# Patient Record
Sex: Male | Born: 1950
Health system: Southern US, Community
[De-identification: ages and names within clinical notes are randomized; demographics above are authoritative.]

## PROBLEM LIST (undated history)

## (undated) DIAGNOSIS — R079 Chest pain, unspecified: Secondary | ICD-10-CM

## (undated) DIAGNOSIS — H409 Unspecified glaucoma: Secondary | ICD-10-CM

## (undated) DIAGNOSIS — R7303 Prediabetes: Secondary | ICD-10-CM

## (undated) DIAGNOSIS — K5790 Diverticulosis of intestine, part unspecified, without perforation or abscess without bleeding: Secondary | ICD-10-CM

## (undated) DIAGNOSIS — J302 Other seasonal allergic rhinitis: Secondary | ICD-10-CM

## (undated) HISTORY — PX: EYE SURGERY: SHX253

## (undated) HISTORY — PX: OTHER SURGICAL HISTORY: SHX169

## (undated) HISTORY — DX: Chest pain, unspecified: R07.9

---

## 2011-06-13 ENCOUNTER — Other Ambulatory Visit: Payer: Self-pay | Admitting: Family Medicine

## 2011-06-13 DIAGNOSIS — M542 Cervicalgia: Secondary | ICD-10-CM

## 2011-06-15 ENCOUNTER — Ambulatory Visit
Admission: RE | Admit: 2011-06-15 | Discharge: 2011-06-15 | Disposition: A | Discharge status: Home / Self Care | Payer: BC Managed Care – PPO | Source: Ambulatory Visit | Attending: Family Medicine | Admitting: Family Medicine

## 2011-06-15 DIAGNOSIS — M542 Cervicalgia: Secondary | ICD-10-CM

## 2013-12-19 ENCOUNTER — Ambulatory Visit (HOSPITAL_BASED_OUTPATIENT_CLINIC_OR_DEPARTMENT_OTHER)
Admission: RE | Admit: 2013-12-19 | Discharge: 2013-12-19 | Disposition: A | Discharge status: Home / Self Care | Payer: BC Managed Care – PPO | Source: Ambulatory Visit | Attending: Chiropractic Medicine | Admitting: Chiropractic Medicine

## 2013-12-19 ENCOUNTER — Other Ambulatory Visit (HOSPITAL_BASED_OUTPATIENT_CLINIC_OR_DEPARTMENT_OTHER): Payer: Self-pay | Admitting: *Deleted

## 2013-12-19 DIAGNOSIS — M545 Low back pain, unspecified: Secondary | ICD-10-CM

## 2017-04-16 ENCOUNTER — Emergency Department (HOSPITAL_COMMUNITY): Admission: EM | Admit: 2017-04-16 | Discharge: 2017-04-16 | Discharge status: Against Medical Advice | Payer: Self-pay

## 2018-01-04 DIAGNOSIS — L603 Nail dystrophy: Secondary | ICD-10-CM | POA: Diagnosis not present

## 2018-01-04 DIAGNOSIS — D225 Melanocytic nevi of trunk: Secondary | ICD-10-CM | POA: Diagnosis not present

## 2018-01-04 DIAGNOSIS — L821 Other seborrheic keratosis: Secondary | ICD-10-CM | POA: Diagnosis not present

## 2018-01-04 DIAGNOSIS — L570 Actinic keratosis: Secondary | ICD-10-CM | POA: Diagnosis not present

## 2018-01-16 DIAGNOSIS — S96912A Strain of unspecified muscle and tendon at ankle and foot level, left foot, initial encounter: Secondary | ICD-10-CM | POA: Diagnosis not present

## 2018-02-12 DIAGNOSIS — N400 Enlarged prostate without lower urinary tract symptoms: Secondary | ICD-10-CM | POA: Diagnosis not present

## 2018-02-12 DIAGNOSIS — G4700 Insomnia, unspecified: Secondary | ICD-10-CM | POA: Diagnosis not present

## 2018-02-12 DIAGNOSIS — J309 Allergic rhinitis, unspecified: Secondary | ICD-10-CM | POA: Diagnosis not present

## 2018-02-12 DIAGNOSIS — Z0000 Encounter for general adult medical examination without abnormal findings: Secondary | ICD-10-CM | POA: Diagnosis not present

## 2018-02-12 DIAGNOSIS — E669 Obesity, unspecified: Secondary | ICD-10-CM | POA: Diagnosis not present

## 2018-02-12 DIAGNOSIS — Z136 Encounter for screening for cardiovascular disorders: Secondary | ICD-10-CM | POA: Diagnosis not present

## 2018-02-12 DIAGNOSIS — Z6832 Body mass index (BMI) 32.0-32.9, adult: Secondary | ICD-10-CM | POA: Diagnosis not present

## 2018-02-12 DIAGNOSIS — Z1159 Encounter for screening for other viral diseases: Secondary | ICD-10-CM | POA: Diagnosis not present

## 2018-02-12 DIAGNOSIS — Z13220 Encounter for screening for lipoid disorders: Secondary | ICD-10-CM | POA: Diagnosis not present

## 2018-02-12 DIAGNOSIS — Z1389 Encounter for screening for other disorder: Secondary | ICD-10-CM | POA: Diagnosis not present

## 2018-02-12 DIAGNOSIS — R351 Nocturia: Secondary | ICD-10-CM | POA: Diagnosis not present

## 2018-02-12 DIAGNOSIS — R350 Frequency of micturition: Secondary | ICD-10-CM | POA: Diagnosis not present

## 2018-02-16 DIAGNOSIS — Z0100 Encounter for examination of eyes and vision without abnormal findings: Secondary | ICD-10-CM | POA: Diagnosis not present

## 2018-04-09 DIAGNOSIS — H401132 Primary open-angle glaucoma, bilateral, moderate stage: Secondary | ICD-10-CM | POA: Diagnosis not present

## 2018-04-23 DIAGNOSIS — K5730 Diverticulosis of large intestine without perforation or abscess without bleeding: Secondary | ICD-10-CM | POA: Diagnosis not present

## 2018-04-23 DIAGNOSIS — K640 First degree hemorrhoids: Secondary | ICD-10-CM | POA: Diagnosis not present

## 2018-04-23 DIAGNOSIS — K635 Polyp of colon: Secondary | ICD-10-CM | POA: Diagnosis not present

## 2018-04-23 DIAGNOSIS — Z1211 Encounter for screening for malignant neoplasm of colon: Secondary | ICD-10-CM | POA: Diagnosis not present

## 2018-04-25 DIAGNOSIS — K635 Polyp of colon: Secondary | ICD-10-CM | POA: Diagnosis not present

## 2018-04-26 ENCOUNTER — Other Ambulatory Visit: Payer: Self-pay | Admitting: Family Medicine

## 2018-04-26 ENCOUNTER — Ambulatory Visit
Admission: RE | Admit: 2018-04-26 | Discharge: 2018-04-26 | Disposition: A | Discharge status: Home / Self Care | Payer: Medicare HMO | Source: Ambulatory Visit | Attending: Family Medicine | Admitting: Family Medicine

## 2018-04-26 DIAGNOSIS — M549 Dorsalgia, unspecified: Secondary | ICD-10-CM

## 2018-04-26 DIAGNOSIS — M48061 Spinal stenosis, lumbar region without neurogenic claudication: Secondary | ICD-10-CM | POA: Diagnosis not present

## 2018-04-26 DIAGNOSIS — G2581 Restless legs syndrome: Secondary | ICD-10-CM | POA: Diagnosis not present

## 2018-10-11 DIAGNOSIS — Z20828 Contact with and (suspected) exposure to other viral communicable diseases: Secondary | ICD-10-CM | POA: Diagnosis not present

## 2018-11-06 DIAGNOSIS — M545 Low back pain: Secondary | ICD-10-CM | POA: Diagnosis not present

## 2018-11-06 DIAGNOSIS — M791 Myalgia, unspecified site: Secondary | ICD-10-CM | POA: Diagnosis not present

## 2018-11-15 DIAGNOSIS — R69 Illness, unspecified: Secondary | ICD-10-CM | POA: Diagnosis not present

## 2019-01-07 DIAGNOSIS — D1801 Hemangioma of skin and subcutaneous tissue: Secondary | ICD-10-CM | POA: Diagnosis not present

## 2019-01-07 DIAGNOSIS — L570 Actinic keratosis: Secondary | ICD-10-CM | POA: Diagnosis not present

## 2019-01-07 DIAGNOSIS — D485 Neoplasm of uncertain behavior of skin: Secondary | ICD-10-CM | POA: Diagnosis not present

## 2019-01-07 DIAGNOSIS — D692 Other nonthrombocytopenic purpura: Secondary | ICD-10-CM | POA: Diagnosis not present

## 2019-01-07 DIAGNOSIS — L821 Other seborrheic keratosis: Secondary | ICD-10-CM | POA: Diagnosis not present

## 2019-01-07 DIAGNOSIS — D225 Melanocytic nevi of trunk: Secondary | ICD-10-CM | POA: Diagnosis not present

## 2019-01-09 ENCOUNTER — Other Ambulatory Visit: Payer: Self-pay

## 2019-01-09 DIAGNOSIS — Z20822 Contact with and (suspected) exposure to covid-19: Secondary | ICD-10-CM

## 2019-01-11 LAB — NOVEL CORONAVIRUS, NAA: SARS-CoV-2, NAA: NOT DETECTED

## 2019-02-06 DIAGNOSIS — H26491 Other secondary cataract, right eye: Secondary | ICD-10-CM | POA: Diagnosis not present

## 2019-02-06 DIAGNOSIS — H401132 Primary open-angle glaucoma, bilateral, moderate stage: Secondary | ICD-10-CM | POA: Diagnosis not present

## 2019-03-12 DIAGNOSIS — Z0000 Encounter for general adult medical examination without abnormal findings: Secondary | ICD-10-CM | POA: Diagnosis not present

## 2019-03-12 DIAGNOSIS — Z6832 Body mass index (BMI) 32.0-32.9, adult: Secondary | ICD-10-CM | POA: Diagnosis not present

## 2019-03-12 DIAGNOSIS — J309 Allergic rhinitis, unspecified: Secondary | ICD-10-CM | POA: Diagnosis not present

## 2019-03-12 DIAGNOSIS — E669 Obesity, unspecified: Secondary | ICD-10-CM | POA: Diagnosis not present

## 2019-03-12 DIAGNOSIS — Z1389 Encounter for screening for other disorder: Secondary | ICD-10-CM | POA: Diagnosis not present

## 2019-03-12 DIAGNOSIS — M25551 Pain in right hip: Secondary | ICD-10-CM | POA: Diagnosis not present

## 2019-03-12 DIAGNOSIS — G4700 Insomnia, unspecified: Secondary | ICD-10-CM | POA: Diagnosis not present

## 2019-03-12 DIAGNOSIS — N400 Enlarged prostate without lower urinary tract symptoms: Secondary | ICD-10-CM | POA: Diagnosis not present

## 2019-03-12 DIAGNOSIS — Z125 Encounter for screening for malignant neoplasm of prostate: Secondary | ICD-10-CM | POA: Diagnosis not present

## 2019-03-12 DIAGNOSIS — Z13220 Encounter for screening for lipoid disorders: Secondary | ICD-10-CM | POA: Diagnosis not present

## 2019-03-19 ENCOUNTER — Ambulatory Visit: Payer: Medicare HMO | Attending: Internal Medicine

## 2019-03-19 DIAGNOSIS — Z20822 Contact with and (suspected) exposure to covid-19: Secondary | ICD-10-CM

## 2019-03-20 LAB — NOVEL CORONAVIRUS, NAA: SARS-CoV-2, NAA: NOT DETECTED

## 2019-04-02 ENCOUNTER — Ambulatory Visit: Payer: Medicare HMO | Attending: Internal Medicine

## 2019-04-02 DIAGNOSIS — Z20822 Contact with and (suspected) exposure to covid-19: Secondary | ICD-10-CM

## 2019-04-02 DIAGNOSIS — R69 Illness, unspecified: Secondary | ICD-10-CM | POA: Diagnosis not present

## 2019-04-03 LAB — NOVEL CORONAVIRUS, NAA: SARS-CoV-2, NAA: NOT DETECTED

## 2019-04-05 DIAGNOSIS — R69 Illness, unspecified: Secondary | ICD-10-CM | POA: Diagnosis not present

## 2019-04-10 DIAGNOSIS — H409 Unspecified glaucoma: Secondary | ICD-10-CM | POA: Diagnosis not present

## 2019-04-10 DIAGNOSIS — N400 Enlarged prostate without lower urinary tract symptoms: Secondary | ICD-10-CM | POA: Diagnosis not present

## 2019-04-25 DIAGNOSIS — R69 Illness, unspecified: Secondary | ICD-10-CM | POA: Diagnosis not present

## 2019-05-01 DIAGNOSIS — R7309 Other abnormal glucose: Secondary | ICD-10-CM | POA: Diagnosis not present

## 2019-05-02 DIAGNOSIS — R69 Illness, unspecified: Secondary | ICD-10-CM | POA: Diagnosis not present

## 2019-05-22 DIAGNOSIS — M25551 Pain in right hip: Secondary | ICD-10-CM | POA: Diagnosis not present

## 2019-05-22 DIAGNOSIS — M545 Low back pain: Secondary | ICD-10-CM | POA: Diagnosis not present

## 2019-05-28 DIAGNOSIS — M1611 Unilateral primary osteoarthritis, right hip: Secondary | ICD-10-CM | POA: Diagnosis not present

## 2019-06-06 NOTE — Progress Notes (Signed)
Need orders in epic.  Surgery on 06/13/19.  Preop on 06/11/19.

## 2019-06-07 DIAGNOSIS — M1611 Unilateral primary osteoarthritis, right hip: Secondary | ICD-10-CM | POA: Diagnosis not present

## 2019-06-08 ENCOUNTER — Other Ambulatory Visit (HOSPITAL_COMMUNITY)
Admission: RE | Admit: 2019-06-08 | Discharge: 2019-06-08 | Disposition: A | Discharge status: Home / Self Care | Payer: Medicare HMO | Source: Ambulatory Visit | Attending: Orthopedic Surgery | Admitting: Orthopedic Surgery

## 2019-06-08 DIAGNOSIS — Z01812 Encounter for preprocedural laboratory examination: Secondary | ICD-10-CM | POA: Insufficient documentation

## 2019-06-08 DIAGNOSIS — Z20822 Contact with and (suspected) exposure to covid-19: Secondary | ICD-10-CM | POA: Insufficient documentation

## 2019-06-08 LAB — SARS CORONAVIRUS 2 (TAT 6-24 HRS): SARS Coronavirus 2: NEGATIVE

## 2019-06-09 ENCOUNTER — Ambulatory Visit: Payer: Self-pay | Admitting: Orthopedic Surgery

## 2019-06-09 NOTE — H&P (View-Only) (Signed)
TOTAL HIP ADMISSION H&P  Patient is admitted for right total hip arthroplasty.  Subjective:  Chief Complaint: right hip pain  HPI: Maurice Garcia, 69 y.o. male, has a history of pain and functional disability in the right hip(s) due to arthritis and patient has failed non-surgical conservative treatments for greater than 12 weeks to include NSAID's and/or analgesics, flexibility and strengthening excercises, use of assistive devices, weight reduction as appropriate and activity modification.  Onset of symptoms was gradual starting 5 years ago with rapidlly worsening course since that time.The patient noted no past surgery on the right hip(s).  Patient currently rates pain in the right hip at 10 out of 10 with activity. Patient has night pain, worsening of pain with activity and weight bearing, trendelenberg gait, pain that interfers with activities of daily living and pain with passive range of motion. Patient has evidence of subchondral cysts, subchondral sclerosis, periarticular osteophytes and joint space narrowing by imaging studies. This condition presents safety issues increasing the risk of falls.  There is no current active infection.  Patient Active Problem List   Diagnosis Date Noted  . Osteoarthritis of right hip 06/12/2019   Past Medical History:  Diagnosis Date  . Diverticulosis   . Glaucoma   . Pre-diabetes   . Seasonal allergies     Past Surgical History:  Procedure Laterality Date  . cervical disc repaired    . EYE SURGERY     cataract surgery bilat     No current facility-administered medications for this visit.   No current outpatient medications on file.   Facility-Administered Medications Ordered in Other Visits  Medication Dose Route Frequency Provider Last Rate Last Admin  . 0.9 %  sodium chloride infusion   Intravenous Continuous Mozella Rexrode, Aaron Edelman, MD      . acetaminophen (OFIRMEV) IV 1,000 mg  1,000 mg Intravenous To OR Max Nuno, Aaron Edelman, MD      . ceFAZolin  (ANCEF) IVPB 2g/100 mL premix  2 g Intravenous On Call to [place], MD      . lactated ringers infusion   Intravenous Continuous Suzette Battiest, MD 100 mL/hr at 06/12/19 0644 New Bag at 06/12/19 [id number]  . tranexamic acid (CYKLOKAPRON) IVPB 1,000 mg  1,000 mg Intravenous To OR Grant Swager, Aaron Edelman, MD       No Known Allergies  Social History   Tobacco Use  . Smoking status: Never Smoker  . Smokeless tobacco: Never Used  Substance Use Topics  . Alcohol use: Never    No family history on file.   Review of Systems  Constitutional: Negative.   HENT: Positive for tinnitus.   Eyes: Negative.   Respiratory: Negative.   Cardiovascular: Negative.   Gastrointestinal: Negative.   Endocrine: Negative.   Genitourinary: Positive for frequency.  Musculoskeletal: Positive for arthralgias and gait problem.  Skin: Negative.   Allergic/Immunologic: Negative.   Psychiatric/Behavioral: Negative.     Objective:  Physical Exam  Vitals reviewed. Constitutional: He is oriented to person, place, and time. He appears well-developed and well-nourished.  HENT:  Head: Normocephalic and atraumatic.  Eyes: Pupils are equal, round, and reactive to light. Conjunctivae and EOM are normal.  Cardiovascular: Normal rate, regular rhythm and intact distal pulses.  Respiratory: Effort normal. No respiratory distress.  GI: Soft. He exhibits no distension.  Genitourinary:    Genitourinary Comments: deferred   Musculoskeletal:     Cervical back: Normal range of motion and neck supple.     Right hip: Bony tenderness present. Decreased range  of motion. Decreased strength.  Neurological: He is alert and oriented to person, place, and time. He has normal reflexes.  Skin: Skin is warm and dry.  Psychiatric: He has a normal mood and affect. His behavior is normal. Judgment and thought content normal.    Vital signs in last 24 hours: @VSRANGES @  Labs:   Estimated body mass index is 32.12 kg/m as  calculated from the following:   Height as of 06/12/19: 5' 11.5" (1.816 m).   Weight as of 06/12/19: 105.9 kg.   Imaging Review Plain radiographs demonstrate severe degenerative joint disease of the right hip(s). The bone quality appears to be adequate for age and reported activity level.      Assessment/Plan:  End stage arthritis, right hip(s)  The patient history, physical examination, clinical judgement of the provider and imaging studies are consistent with end stage degenerative joint disease of the right hip(s) and total hip arthroplasty is deemed medically necessary. The treatment options including medical management, injection therapy, arthroscopy and arthroplasty were discussed at length. The risks and benefits of total hip arthroplasty were presented and reviewed. The risks due to aseptic loosening, infection, stiffness, dislocation/subluxation,  thromboembolic complications and other imponderables were discussed.  The patient acknowledged the explanation, agreed to proceed with the plan and consent was signed. Patient is being admitted for inpatient treatment for surgery, pain control, PT, OT, prophylactic antibiotics, VTE prophylaxis, progressive ambulation and ADL's and discharge planning.The patient is planning to be discharged home with HEP    Patient's anticipated LOS is less than 2 midnights, meeting these requirements: - Younger than 28 - Lives within 1 hour of care - Has a competent adult at home to recover with post-op recover - NO history of  - Chronic pain requiring opiods  - Diabetes  - Coronary Artery Disease  - Heart failure  - Heart attack  - Stroke  - DVT/VTE  - Cardiac arrhythmia  - Respiratory Failure/COPD  - Renal failure  - Anemia  - Advanced Liver disease

## 2019-06-09 NOTE — H&P (Signed)
TOTAL HIP ADMISSION H&P  Patient is admitted for right total hip arthroplasty.  Subjective:  Chief Complaint: right hip pain  HPI: Maurice Garcia, 69 y.o. male, has a history of pain and functional disability in the right hip(s) due to arthritis and patient has failed non-surgical conservative treatments for greater than 12 weeks to include NSAID's and/or analgesics, flexibility and strengthening excercises, use of assistive devices, weight reduction as appropriate and activity modification.  Onset of symptoms was gradual starting 5 years ago with rapidlly worsening course since that time.The patient noted no past surgery on the right hip(s).  Patient currently rates pain in the right hip at 10 out of 10 with activity. Patient has night pain, worsening of pain with activity and weight bearing, trendelenberg gait, pain that interfers with activities of daily living and pain with passive range of motion. Patient has evidence of subchondral cysts, subchondral sclerosis, periarticular osteophytes and joint space narrowing by imaging studies. This condition presents safety issues increasing the risk of falls.  There is no current active infection.  Patient Active Problem List   Diagnosis Date Noted  . Osteoarthritis of right hip 06/12/2019   Past Medical History:  Diagnosis Date  . Diverticulosis   . Glaucoma   . Pre-diabetes   . Seasonal allergies     Past Surgical History:  Procedure Laterality Date  . cervical disc repaired    . EYE SURGERY     cataract surgery bilat     No current facility-administered medications for this visit.   No current outpatient medications on file.   Facility-Administered Medications Ordered in Other Visits  Medication Dose Route Frequency Provider Last Rate Last Admin  . 0.9 %  sodium chloride infusion   Intravenous Continuous Rei Medlen, Aaron Edelman, MD      . acetaminophen (OFIRMEV) IV 1,000 mg  1,000 mg Intravenous To OR Treylen Gibbs, Aaron Edelman, MD      . ceFAZolin  (ANCEF) IVPB 2g/100 mL premix  2 g Intravenous On Call to [place], MD      . lactated ringers infusion   Intravenous Continuous Suzette Battiest, MD 100 mL/hr at 06/12/19 0644 New Bag at 06/12/19 [id number]  . tranexamic acid (CYKLOKAPRON) IVPB 1,000 mg  1,000 mg Intravenous To OR Shamiyah Ngu, Aaron Edelman, MD       No Known Allergies  Social History   Tobacco Use  . Smoking status: Never Smoker  . Smokeless tobacco: Never Used  Substance Use Topics  . Alcohol use: Never    No family history on file.   Review of Systems  Constitutional: Negative.   HENT: Positive for tinnitus.   Eyes: Negative.   Respiratory: Negative.   Cardiovascular: Negative.   Gastrointestinal: Negative.   Endocrine: Negative.   Genitourinary: Positive for frequency.  Musculoskeletal: Positive for arthralgias and gait problem.  Skin: Negative.   Allergic/Immunologic: Negative.   Psychiatric/Behavioral: Negative.     Objective:  Physical Exam  Vitals reviewed. Constitutional: He is oriented to person, place, and time. He appears well-developed and well-nourished.  HENT:  Head: Normocephalic and atraumatic.  Eyes: Pupils are equal, round, and reactive to light. Conjunctivae and EOM are normal.  Cardiovascular: Normal rate, regular rhythm and intact distal pulses.  Respiratory: Effort normal. No respiratory distress.  GI: Soft. He exhibits no distension.  Genitourinary:    Genitourinary Comments: deferred   Musculoskeletal:     Cervical back: Normal range of motion and neck supple.     Right hip: Bony tenderness present. Decreased range  of motion. Decreased strength.  Neurological: He is alert and oriented to person, place, and time. He has normal reflexes.  Skin: Skin is warm and dry.  Psychiatric: He has a normal mood and affect. His behavior is normal. Judgment and thought content normal.    Vital signs in last 24 hours: @VSRANGES @  Labs:   Estimated body mass index is 32.12 kg/m as  calculated from the following:   Height as of 06/12/19: 5' 11.5" (1.816 m).   Weight as of 06/12/19: 105.9 kg.   Imaging Review Plain radiographs demonstrate severe degenerative joint disease of the right hip(s). The bone quality appears to be adequate for age and reported activity level.      Assessment/Plan:  End stage arthritis, right hip(s)  The patient history, physical examination, clinical judgement of the provider and imaging studies are consistent with end stage degenerative joint disease of the right hip(s) and total hip arthroplasty is deemed medically necessary. The treatment options including medical management, injection therapy, arthroscopy and arthroplasty were discussed at length. The risks and benefits of total hip arthroplasty were presented and reviewed. The risks due to aseptic loosening, infection, stiffness, dislocation/subluxation,  thromboembolic complications and other imponderables were discussed.  The patient acknowledged the explanation, agreed to proceed with the plan and consent was signed. Patient is being admitted for inpatient treatment for surgery, pain control, PT, OT, prophylactic antibiotics, VTE prophylaxis, progressive ambulation and ADL's and discharge planning.The patient is planning to be discharged home with HEP    Patient's anticipated LOS is less than 2 midnights, meeting these requirements: - Younger than 43 - Lives within 1 hour of care - Has a competent adult at home to recover with post-op recover - NO history of  - Chronic pain requiring opiods  - Diabetes  - Coronary Artery Disease  - Heart failure  - Heart attack  - Stroke  - DVT/VTE  - Cardiac arrhythmia  - Respiratory Failure/COPD  - Renal failure  - Anemia  - Advanced Liver disease

## 2019-06-10 NOTE — Progress Notes (Signed)
DUE TO COVID-19 ONLY ONE VISITOR IS ALLOWED TO COME WITH YOU AND STAY IN THE WAITING ROOM ONLY DURING PRE OP AND PROCEDURE DAY OF SURGERY. THE 1 VISITOR MAY VISIT WITH YOU AFTER SURGERY IN YOUR PRIVATE ROOM DURING VISITING HOURS ONLY!  YOU NEED TO HAVE A COVID 19 TEST ON_______ @_______ , THIS TEST MUST BE DONE BEFORE SURGERY, COME  [place], [place] , [address].  ([place] [place]) ONCE YOUR COVID TEST IS COMPLETED, PLEASE BEGIN THE QUARANTINE INSTRUCTIONS AS OUTLINED IN YOUR HANDOUT.                Maurice Garcia  06/10/2019   Your procedure is scheduled on:  06/12/2019   Report to [facility] Main  Entrance   Report to admitting at    0600 AM     Call this number if you have problems the morning of surgery [phone]    Remember: Do not eat food   :After Midnight. BRUSH YOUR TEETH MORNING OF SURGERY AND RINSE YOUR MOUTH OUT, NO CHEWING GUM CANDY OR MINTS.     Take these medicines the morning of surgery with A SIP OF WATER: Eye drops as usual     S                               You may not have any metal on your body including hair pins and              piercings  Do not wear jewelry,  lotions, powders or perfumes, deodorant              Do not shave  48 hours prior to surgery.              Men may shave face and neck.   Do not bring valuables to the hospital. [place].  Contacts, dentures or bridgework may not be worn into surgery.  Leave suitcase in the car. After surgery it may be brought to your room.     Patients discharged the day of surgery will not be allowed to drive home. IF YOU ARE HAVING SURGERY AND GOING HOME THE SAME DAY, YOU MUST HAVE AN ADULT TO DRIVE YOU HOME AND BE WITH YOU FOR 24 HOURS. YOU MAY GO HOME BY TAXI OR UBER OR ORTHERWISE, BUT AN ADULT MUST ACCOMPANY YOU HOME AND STAY WITH YOU FOR 24 HOURS.  Name and phone number of your driver:  Special Instructions: N/A    Please read over the following fact sheets you were given: _____________________________________________________________________             NO SOLID FOOD AFTER MIDNIGHT THE NIGHT PRIOR TO SURGERY. NOTHING BY MOUTH EXCEPT CLEAR LIQUIDS UNTIL   0530am  . PLEASE FINISH ENSURE DRINK PER SURGEON ORDER  WHICH NEEDS TO BE COMPLETED AT 0530am    CLEAR LIQUID DIET   Foods Allowed                                                                     Foods Excluded  Coffee and tea, regular and decaf                             liquids that you cannot  Plain Jell-O any favor except red or purple                                           see through such as: Fruit ices (not with fruit pulp)                                     milk, soups, orange juice  Iced Popsicles                                    All solid food Carbonated beverages, regular and diet                                    Cranberry, grape and apple juices Sports drinks like Gatorade Lightly seasoned clear broth or consume(fat free) Sugar, honey syrup  Sample Menu Breakfast                                Lunch                                     Supper Cranberry juice                    Beef broth                            Chicken broth Jell-O                                     Grape juice                           Apple juice Coffee or tea                        Jell-O                                      Popsicle                                                Coffee or tea                        Coffee or tea  _____________________________________________________________________  [facility] - Preparing for Surgery Before surgery, you can play an important role.  Because skin is not sterile, your skin needs to be  as free of germs as possible.  You can reduce the number of germs on your skin by washing with CHG (chlorahexidine gluconate) soap before surgery.  CHG is an antiseptic cleaner which kills germs and bonds with the  skin to continue killing germs even after washing. Please DO NOT use if you have an allergy to CHG or antibacterial soaps.  If your skin becomes reddened/irritated stop using the CHG and inform your nurse when you arrive at Short Stay. Do not shave (including legs and underarms) for at least 48 hours prior to the first CHG shower.  You may shave your face/neck. Please follow these instructions carefully:  1.  Shower with CHG Soap the night before surgery and the  morning of Surgery.  2.  If you choose to wash your hair, wash your hair first as usual with your  normal  shampoo.  3.  After you shampoo, rinse your hair and body thoroughly to remove the  shampoo.                           4.  Use CHG as you would any other liquid soap.  You can apply chg directly  to the skin and wash                       Gently with a scrungie or clean washcloth.  5.  Apply the CHG Soap to your body ONLY FROM THE NECK DOWN.   Do not use on face/ open                           Wound or open sores. Avoid contact with eyes, ears mouth and genitals (private parts).                       Wash face,  Genitals (private parts) with your normal soap.             6.  Wash thoroughly, paying special attention to the area where your surgery  will be performed.  7.  Thoroughly rinse your body with warm water from the neck down.  8.  DO NOT shower/wash with your normal soap after using and rinsing off  the CHG Soap.                9.  Pat yourself dry with a clean towel.            10.  Wear clean pajamas.            11.  Place clean sheets on your bed the night of your first shower and do not  sleep with pets. Day of Surgery : Do not apply any lotions/deodorants the morning of surgery.  Please wear clean clothes to the hospital/surgery center.  FAILURE TO FOLLOW THESE INSTRUCTIONS MAY RESULT IN THE CANCELLATION OF YOUR SURGERY PATIENT SIGNATURE_________________________________  NURSE  SIGNATURE__________________________________  ________________________________________________________________________   Adam Phenix  An incentive spirometer is a tool that can help keep your lungs clear and active. This tool measures how well you are filling your lungs with each breath. Taking long deep breaths may help reverse or decrease the chance of developing breathing (pulmonary) problems (especially infection) following:  A long period of time when you are unable to move or be active. BEFORE THE PROCEDURE   If the spirometer includes an indicator to show your best  effort, your nurse or respiratory therapist will set it to a desired goal.  If possible, sit up straight or lean slightly forward. Try not to slouch.  Hold the incentive spirometer in an upright position. INSTRUCTIONS FOR USE  1. Sit on the edge of your bed if possible, or sit up as far as you can in bed or on a chair. 2. Hold the incentive spirometer in an upright position. 3. Breathe out normally. 4. Place the mouthpiece in your mouth and seal your lips tightly around it. 5. Breathe in slowly and as deeply as possible, raising the piston or the ball toward the top of the column. 6. Hold your breath for 3-5 seconds or for as long as possible. Allow the piston or ball to fall to the bottom of the column. 7. Remove the mouthpiece from your mouth and breathe out normally. 8. Rest for a few seconds and repeat Steps 1 through 7 at least 10 times every 1-2 hours when you are awake. Take your time and take a few normal breaths between deep breaths. 9. The spirometer may include an indicator to show your best effort. Use the indicator as a goal to work toward during each repetition. 10. After each set of 10 deep breaths, practice coughing to be sure your lungs are clear. If you have an incision (the cut made at the time of surgery), support your incision when coughing by placing a pillow or rolled up towels firmly  against it. Once you are able to get out of bed, walk around indoors and cough well. You may stop using the incentive spirometer when instructed by your caregiver.  RISKS AND COMPLICATIONS  Take your time so you do not get dizzy or light-headed.  If you are in pain, you may need to take or ask for pain medication before doing incentive spirometry. It is harder to take a deep breath if you are having pain. AFTER USE  Rest and breathe slowly and easily.  It can be helpful to keep track of a log of your progress. Your caregiver can provide you with a simple table to help with this. If you are using the spirometer at home, follow these instructions: [place] IF:   You are having difficultly using the spirometer.  You have trouble using the spirometer as often as instructed.  Your pain medication is not giving enough relief while using the spirometer.  You develop fever of 100.5 F (38.1 C) or higher. SEEK IMMEDIATE MEDICAL CARE IF:   You cough up bloody sputum that had not been present before.  You develop fever of 102 F (38.9 C) or greater.  You develop worsening pain at or near the incision site. MAKE SURE YOU:   Understand these instructions.  Will watch your condition.  Will get help right away if you are not doing well or get worse. Document Released: 06/27/2006 Document Revised: 05/09/2011 Document Reviewed: 08/28/2006 ExitCare Patient Information 2014 ExitCare, [place].   ________________________________________________________________________  WHAT IS A BLOOD TRANSFUSION? Blood Transfusion Information  A transfusion is the replacement of blood or some of its parts. Blood is made up of multiple cells which provide different functions.  Red blood cells carry oxygen and are used for blood loss replacement.  White blood cells fight against infection.  Platelets control bleeding.  Plasma helps clot blood.  Other blood products are available for  specialized needs, such as hemophilia or other clotting disorders. BEFORE THE TRANSFUSION  Who gives blood for transfusions?  Healthy volunteers who are fully evaluated to make sure their blood is safe. This is blood bank blood. Transfusion therapy is the safest it has ever been in the practice of medicine. Before blood is taken from a donor, a complete history is taken to make sure that person has no history of diseases nor engages in risky social behavior (examples are intravenous drug use or sexual activity with multiple partners). The donor's travel history is screened to minimize risk of transmitting infections, such as malaria. The donated blood is tested for signs of infectious diseases, such as HIV and hepatitis. The blood is then tested to be sure it is compatible with you in order to minimize the chance of a transfusion reaction. If you or a relative donates blood, this is often done in anticipation of surgery and is not appropriate for emergency situations. It takes many days to process the donated blood. RISKS AND COMPLICATIONS Although transfusion therapy is very safe and saves many lives, the main dangers of transfusion include:   Getting an infectious disease.  Developing a transfusion reaction. This is an allergic reaction to something in the blood you were given. Every precaution is taken to prevent this. The decision to have a blood transfusion has been considered carefully by your caregiver before blood is given. Blood is not given unless the benefits outweigh the risks. AFTER THE TRANSFUSION  Right after receiving a blood transfusion, you will usually feel much better and more energetic. This is especially true if your red blood cells have gotten low (anemic). The transfusion raises the level of the red blood cells which carry oxygen, and this usually causes an energy increase.  The nurse administering the transfusion will monitor you carefully for complications. HOME CARE  INSTRUCTIONS  No special instructions are needed after a transfusion. You may find your energy is better. Speak with your caregiver about any limitations on activity for underlying diseases you may have. SEEK MEDICAL CARE IF:   Your condition is not improving after your transfusion.  You develop redness or irritation at the intravenous (IV) site. SEEK IMMEDIATE MEDICAL CARE IF:  Any of the following symptoms occur over the next 12 hours:  Shaking chills.  You have a temperature by mouth above 102 F (38.9 C), not controlled by medicine.  Chest, back, or muscle pain.  People around you feel you are not acting correctly or are confused.  Shortness of breath or difficulty breathing.  Dizziness and fainting.  You get a rash or develop hives.  You have a decrease in urine output.  Your urine turns a dark color or changes to pink, red, or brown. Any of the following symptoms occur over the next 10 days:  You have a temperature by mouth above 102 F (38.9 C), not controlled by medicine.  Shortness of breath.  Weakness after normal activity.  The white part of the eye turns yellow (jaundice).  You have a decrease in the amount of urine or are urinating less often.  Your urine turns a dark color or changes to pink, red, or brown. Document Released: 02/12/2000 Document Revised: 05/09/2011 Document Reviewed: 10/01/2007 [facility] Patient Information 2014 [place], [place].  _______________________________________________________________________

## 2019-06-11 ENCOUNTER — Other Ambulatory Visit: Payer: Self-pay

## 2019-06-11 ENCOUNTER — Encounter (HOSPITAL_COMMUNITY)
Admission: RE | Admit: 2019-06-11 | Discharge: 2019-06-11 | Disposition: A | Discharge status: Home / Self Care | Payer: Medicare HMO | Source: Ambulatory Visit | Attending: Orthopedic Surgery | Admitting: Orthopedic Surgery

## 2019-06-11 ENCOUNTER — Encounter (HOSPITAL_COMMUNITY): Payer: Self-pay | Admitting: Orthopedic Surgery

## 2019-06-11 ENCOUNTER — Encounter (HOSPITAL_COMMUNITY): Payer: Self-pay

## 2019-06-11 ENCOUNTER — Other Ambulatory Visit (HOSPITAL_COMMUNITY): Payer: Medicare HMO

## 2019-06-11 DIAGNOSIS — Z01812 Encounter for preprocedural laboratory examination: Secondary | ICD-10-CM | POA: Insufficient documentation

## 2019-06-11 HISTORY — DX: Other seasonal allergic rhinitis: J30.2

## 2019-06-11 HISTORY — DX: Unspecified glaucoma: H40.9

## 2019-06-11 HISTORY — DX: Prediabetes: R73.03

## 2019-06-11 HISTORY — DX: Diverticulosis of intestine, part unspecified, without perforation or abscess without bleeding: K57.90

## 2019-06-11 LAB — CBC
HCT: 47.6 % (ref 39.0–52.0)
Hemoglobin: 15.5 g/dL (ref 13.0–17.0)
MCH: 29.9 pg (ref 26.0–34.0)
MCHC: 32.6 g/dL (ref 30.0–36.0)
MCV: 91.7 fL (ref 80.0–100.0)
Platelets: 215 10*3/uL (ref 150–400)
RBC: 5.19 MIL/uL (ref 4.22–5.81)
RDW: 12.1 % (ref 11.5–15.5)
WBC: 5.7 10*3/uL (ref 4.0–10.5)
nRBC: 0 % (ref 0.0–0.2)

## 2019-06-11 LAB — URINALYSIS, ROUTINE W REFLEX MICROSCOPIC
Bilirubin Urine: NEGATIVE
Glucose, UA: NEGATIVE mg/dL
Hgb urine dipstick: NEGATIVE
Ketones, ur: NEGATIVE mg/dL
Leukocytes,Ua: NEGATIVE
Nitrite: NEGATIVE
Protein, ur: NEGATIVE mg/dL
Specific Gravity, Urine: 1.013 (ref 1.005–1.030)
pH: 7 (ref 5.0–8.0)

## 2019-06-11 LAB — COMPREHENSIVE METABOLIC PANEL
ALT: 28 U/L (ref 0–44)
AST: 27 U/L (ref 15–41)
Albumin: 4.3 g/dL (ref 3.5–5.0)
Alkaline Phosphatase: 49 U/L (ref 38–126)
Anion gap: 8 (ref 5–15)
BUN: 29 mg/dL — ABNORMAL HIGH (ref 8–23)
CO2: 28 mmol/L (ref 22–32)
Calcium: 9 mg/dL (ref 8.9–10.3)
Chloride: 102 mmol/L (ref 98–111)
Creatinine, Ser: 1.07 mg/dL (ref 0.61–1.24)
GFR calc Af Amer: >60 mL/min (ref >60)
GFR calc non Af Amer: >60 mL/min (ref >60)
Glucose, Bld: 105 mg/dL — ABNORMAL HIGH (ref 70–99)
Potassium: 4.7 mmol/L (ref 3.5–5.1)
Sodium: 138 mmol/L (ref 135–145)
Total Bilirubin: 1 mg/dL (ref 0.3–1.2)
Total Protein: 7.4 g/dL (ref 6.5–8.1)

## 2019-06-11 LAB — SURGICAL PCR SCREEN
MRSA, PCR: NEGATIVE
Staphylococcus aureus: NEGATIVE

## 2019-06-11 LAB — ABO/RH: ABO/RH(D): A POS

## 2019-06-11 LAB — PROTIME-INR
INR: 1 (ref 0.8–1.2)
Prothrombin Time: 12.8 seconds (ref 11.4–15.2)

## 2019-06-11 NOTE — Anesthesia Preprocedure Evaluation (Addendum)
Anesthesia Evaluation  Patient identified by MRN, date of birth, ID band Patient awake    Reviewed: Allergy & Precautions, NPO status , Patient's Chart, lab work & pertinent test results  Airway Mallampati: II  TM Distance: >3 FB Neck ROM: Full    Dental no notable dental hx. (+) Teeth Intact, Caps   Pulmonary neg pulmonary ROS,    Pulmonary exam normal breath sounds clear to auscultation       Cardiovascular negative cardio ROS Normal cardiovascular exam Rhythm:Regular Rate:Normal     Neuro/Psych Restless legs syndrome Glaucoma negative psych ROS   GI/Hepatic Neg liver ROS, GERD  Medicated,Diverticulosis   Endo/Other  Gout Pre diabetes Obesity  Renal/GU negative Renal ROS   BPH    Musculoskeletal  (+) Arthritis , Osteoarthritis,  DJD right hip   Abdominal (+) + obese,   Peds  Hematology   Anesthesia Other Findings   Reproductive/Obstetrics                            Anesthesia Physical Anesthesia Plan  ASA: II  Anesthesia Plan: Spinal   Post-op Pain Management:    Induction:   PONV Risk Score and Plan: Propofol infusion, Scopolamine patch - Pre-op, Midazolam, Ondansetron and Treatment may vary due to age or medical condition  Airway Management Planned: Natural Airway, Simple Face Mask and Nasal Cannula  Additional Equipment:   Intra-op Plan:   Post-operative Plan:   Informed Consent: I have reviewed the patients History and Physical, chart, labs and discussed the procedure including the risks, benefits and alternatives for the proposed anesthesia with the patient or authorized representative who has indicated his/her understanding and acceptance.     Dental advisory given  Plan Discussed with: CRNA, Anesthesiologist and Surgeon  Anesthesia Plan Comments:        Anesthesia Quick Evaluation

## 2019-06-12 ENCOUNTER — Ambulatory Visit (HOSPITAL_COMMUNITY): Payer: Medicare HMO | Admitting: Physician Assistant

## 2019-06-12 ENCOUNTER — Ambulatory Visit (HOSPITAL_COMMUNITY)
Admission: RE | Admit: 2019-06-12 | Discharge: 2019-06-12 | Disposition: A | Discharge status: Home / Self Care | Payer: Medicare HMO | Attending: Orthopedic Surgery | Admitting: Orthopedic Surgery

## 2019-06-12 ENCOUNTER — Encounter (HOSPITAL_COMMUNITY): Payer: Self-pay | Admitting: Orthopedic Surgery

## 2019-06-12 ENCOUNTER — Ambulatory Visit (HOSPITAL_COMMUNITY): Payer: Medicare HMO

## 2019-06-12 ENCOUNTER — Ambulatory Visit (HOSPITAL_COMMUNITY): Payer: Medicare HMO | Admitting: Certified Registered Nurse Anesthetist

## 2019-06-12 ENCOUNTER — Other Ambulatory Visit: Payer: Self-pay

## 2019-06-12 ENCOUNTER — Encounter (HOSPITAL_COMMUNITY)
Admission: RE | Disposition: A | Discharge status: Home / Self Care | Payer: Self-pay | Source: Home / Self Care | Attending: Orthopedic Surgery

## 2019-06-12 DIAGNOSIS — Z6832 Body mass index (BMI) 32.0-32.9, adult: Secondary | ICD-10-CM | POA: Insufficient documentation

## 2019-06-12 DIAGNOSIS — Z79899 Other long term (current) drug therapy: Secondary | ICD-10-CM | POA: Insufficient documentation

## 2019-06-12 DIAGNOSIS — Z471 Aftercare following joint replacement surgery: Secondary | ICD-10-CM | POA: Diagnosis not present

## 2019-06-12 DIAGNOSIS — K219 Gastro-esophageal reflux disease without esophagitis: Secondary | ICD-10-CM | POA: Diagnosis not present

## 2019-06-12 DIAGNOSIS — K5790 Diverticulosis of intestine, part unspecified, without perforation or abscess without bleeding: Secondary | ICD-10-CM | POA: Insufficient documentation

## 2019-06-12 DIAGNOSIS — M109 Gout, unspecified: Secondary | ICD-10-CM | POA: Insufficient documentation

## 2019-06-12 DIAGNOSIS — R7303 Prediabetes: Secondary | ICD-10-CM | POA: Diagnosis not present

## 2019-06-12 DIAGNOSIS — E669 Obesity, unspecified: Secondary | ICD-10-CM | POA: Diagnosis not present

## 2019-06-12 DIAGNOSIS — N400 Enlarged prostate without lower urinary tract symptoms: Secondary | ICD-10-CM | POA: Insufficient documentation

## 2019-06-12 DIAGNOSIS — M1611 Unilateral primary osteoarthritis, right hip: Secondary | ICD-10-CM

## 2019-06-12 DIAGNOSIS — G2581 Restless legs syndrome: Secondary | ICD-10-CM | POA: Diagnosis not present

## 2019-06-12 DIAGNOSIS — Z09 Encounter for follow-up examination after completed treatment for conditions other than malignant neoplasm: Secondary | ICD-10-CM

## 2019-06-12 DIAGNOSIS — Z419 Encounter for procedure for purposes other than remedying health state, unspecified: Secondary | ICD-10-CM

## 2019-06-12 DIAGNOSIS — Z96641 Presence of right artificial hip joint: Secondary | ICD-10-CM | POA: Diagnosis not present

## 2019-06-12 HISTORY — PX: TOTAL HIP ARTHROPLASTY: SHX124

## 2019-06-12 LAB — TYPE AND SCREEN
ABO/RH(D): A POS
ABO/RH(D): A POS
Antibody Screen: NEGATIVE
Antibody Screen: NEGATIVE

## 2019-06-12 SURGERY — ARTHROPLASTY, HIP, TOTAL, ANTERIOR APPROACH
Anesthesia: Spinal | Site: Hip | Laterality: Right

## 2019-06-12 MED ORDER — MIDAZOLAM HCL 2 MG/2ML IJ SOLN
INTRAMUSCULAR | Status: AC
  Filled 2019-06-12: qty 2

## 2019-06-12 MED ORDER — PHENOL 1.4 % MT LIQD: 1.0000 | OROMUCOSAL | Status: DC | PRN

## 2019-06-12 MED ORDER — TRANEXAMIC ACID-NACL 1000-0.7 MG/100ML-% IV SOLN
1000.0000 mg | INTRAVENOUS | Status: AC
  Administered 2019-06-12: 1000 mg via INTRAVENOUS
  Filled 2019-06-12: qty 100

## 2019-06-12 MED ORDER — METHOCARBAMOL 500 MG PO TABS: 500.0000 mg | ORAL_TABLET | Freq: Four times a day (QID) | ORAL | Status: DC | PRN

## 2019-06-12 MED ORDER — SENNA 8.6 MG PO TABS: 2.0000 | ORAL_TABLET | Freq: Every day | ORAL | 1 refills | Status: AC

## 2019-06-12 MED ORDER — SODIUM CHLORIDE (PF) 0.9 % IJ SOLN
INTRAMUSCULAR | Status: AC
  Filled 2019-06-12: qty 50

## 2019-06-12 MED ORDER — PHENYLEPHRINE HCL (PRESSORS) 10 MG/ML IV SOLN
INTRAVENOUS | Status: AC
  Filled 2019-06-12: qty 1

## 2019-06-12 MED ORDER — ONDANSETRON HCL 4 MG/2ML IJ SOLN: 4.0000 mg | Freq: Four times a day (QID) | INTRAMUSCULAR | Status: DC | PRN

## 2019-06-12 MED ORDER — METOCLOPRAMIDE HCL 5 MG PO TABS: 5.0000 mg | ORAL_TABLET | Freq: Three times a day (TID) | ORAL | Status: DC | PRN

## 2019-06-12 MED ORDER — ASPIRIN 81 MG PO CHEW: 81.0000 mg | CHEWABLE_TABLET | Freq: Two times a day (BID) | ORAL | 0 refills | Status: AC

## 2019-06-12 MED ORDER — POVIDONE-IODINE 10 % EX SWAB
2.0000 "application " | Freq: Once | CUTANEOUS | Status: AC
  Administered 2019-06-12: 2 via TOPICAL

## 2019-06-12 MED ORDER — ONDANSETRON HCL 4 MG/2ML IJ SOLN
INTRAMUSCULAR | Status: DC | PRN
  Administered 2019-06-12: 4 mg via INTRAVENOUS

## 2019-06-12 MED ORDER — SODIUM CHLORIDE 0.9 % IV SOLN: INTRAVENOUS | Status: DC

## 2019-06-12 MED ORDER — METHOCARBAMOL 500 MG IVPB - SIMPLE MED: 500.0000 mg | Freq: Four times a day (QID) | INTRAVENOUS | Status: DC | PRN

## 2019-06-12 MED ORDER — LACTATED RINGERS IV BOLUS
250.0000 mL | Freq: Once | INTRAVENOUS | Status: AC
  Administered 2019-06-12: 250 mL via INTRAVENOUS

## 2019-06-12 MED ORDER — MENTHOL 3 MG MT LOZG: 1.0000 | LOZENGE | OROMUCOSAL | Status: DC | PRN

## 2019-06-12 MED ORDER — KETOROLAC TROMETHAMINE 15 MG/ML IJ SOLN: 7.5000 mg | Freq: Four times a day (QID) | INTRAMUSCULAR | Status: DC

## 2019-06-12 MED ORDER — PROPOFOL 500 MG/50ML IV EMUL
INTRAVENOUS | Status: DC | PRN
  Administered 2019-06-12: 100 ug/kg/min via INTRAVENOUS

## 2019-06-12 MED ORDER — SODIUM CHLORIDE 0.9 % IR SOLN
Status: DC | PRN
  Administered 2019-06-12: 3000 mL
  Administered 2019-06-12: 1000 mL

## 2019-06-12 MED ORDER — WATER FOR IRRIGATION, STERILE IR SOLN
Status: DC | PRN
  Administered 2019-06-12: 2000 mL

## 2019-06-12 MED ORDER — BUPIVACAINE HCL (PF) 0.75 % IJ SOLN
INTRAMUSCULAR | Status: DC | PRN
  Administered 2019-06-12: 2 mL via INTRATHECAL

## 2019-06-12 MED ORDER — LACTATED RINGERS IV SOLN: INTRAVENOUS | Status: DC

## 2019-06-12 MED ORDER — BUPIVACAINE-EPINEPHRINE 0.5% -1:200000 IJ SOLN
INTRAMUSCULAR | Status: DC | PRN
  Administered 2019-06-12: 30 mL

## 2019-06-12 MED ORDER — CEFAZOLIN SODIUM-DEXTROSE 2-4 GM/100ML-% IV SOLN
2.0000 g | INTRAVENOUS | Status: AC
  Administered 2019-06-12: 09:00:00 2 g via INTRAVENOUS
  Filled 2019-06-12: qty 100

## 2019-06-12 MED ORDER — ACETAMINOPHEN 10 MG/ML IV SOLN
1000.0000 mg | INTRAVENOUS | Status: AC
  Administered 2019-06-12: 1000 mg via INTRAVENOUS
  Filled 2019-06-12: qty 100

## 2019-06-12 MED ORDER — PHENYLEPHRINE HCL-NACL 10-0.9 MG/250ML-% IV SOLN
INTRAVENOUS | Status: DC | PRN
  Administered 2019-06-12: 30 ug/min via INTRAVENOUS

## 2019-06-12 MED ORDER — ISOPROPYL ALCOHOL 70 % SOLN
Status: DC | PRN
  Administered 2019-06-12: 1 via TOPICAL

## 2019-06-12 MED ORDER — HYDROCODONE-ACETAMINOPHEN 7.5-325 MG PO TABS: 1.0000 | ORAL_TABLET | ORAL | Status: DC | PRN

## 2019-06-12 MED ORDER — HYDROCODONE-ACETAMINOPHEN 5-325 MG PO TABS: 1.0000 | ORAL_TABLET | ORAL | 0 refills | Status: DC | PRN

## 2019-06-12 MED ORDER — POVIDONE-IODINE 10 % EX SWAB: 2.0000 "application " | Freq: Once | CUTANEOUS | Status: AC

## 2019-06-12 MED ORDER — ISOPROPYL ALCOHOL 70 % SOLN
Status: AC
  Filled 2019-06-12: qty 480

## 2019-06-12 MED ORDER — MORPHINE SULFATE (PF) 4 MG/ML IV SOLN: 0.5000 mg | INTRAVENOUS | Status: DC | PRN

## 2019-06-12 MED ORDER — KETOROLAC TROMETHAMINE 30 MG/ML IJ SOLN
INTRAMUSCULAR | Status: AC
  Filled 2019-06-12: qty 1

## 2019-06-12 MED ORDER — KETOROLAC TROMETHAMINE 30 MG/ML IJ SOLN
INTRAMUSCULAR | Status: DC | PRN
  Administered 2019-06-12: 30 mg via INTRAVENOUS

## 2019-06-12 MED ORDER — SENNA 8.6 MG PO TABS: 1.0000 | ORAL_TABLET | Freq: Two times a day (BID) | ORAL | Status: DC

## 2019-06-12 MED ORDER — EPHEDRINE 5 MG/ML INJ
INTRAVENOUS | Status: AC
  Filled 2019-06-12: qty 10

## 2019-06-12 MED ORDER — PROPOFOL 1000 MG/100ML IV EMUL
INTRAVENOUS | Status: AC
  Filled 2019-06-12: qty 100

## 2019-06-12 MED ORDER — ONDANSETRON HCL 4 MG/2ML IJ SOLN: 4.0000 mg | Freq: Once | INTRAMUSCULAR | Status: DC | PRN

## 2019-06-12 MED ORDER — HYDROCODONE-ACETAMINOPHEN 5-325 MG PO TABS: 1.0000 | ORAL_TABLET | ORAL | Status: DC | PRN

## 2019-06-12 MED ORDER — FENTANYL CITRATE (PF) 100 MCG/2ML IJ SOLN
INTRAMUSCULAR | Status: DC | PRN
  Administered 2019-06-12: 50 ug via INTRAVENOUS

## 2019-06-12 MED ORDER — MIDAZOLAM HCL 5 MG/5ML IJ SOLN
INTRAMUSCULAR | Status: DC | PRN
  Administered 2019-06-12: 2 mg via INTRAVENOUS

## 2019-06-12 MED ORDER — METOCLOPRAMIDE HCL 5 MG/ML IJ SOLN: 5.0000 mg | Freq: Three times a day (TID) | INTRAMUSCULAR | Status: DC | PRN

## 2019-06-12 MED ORDER — LACTATED RINGERS IV BOLUS
500.0000 mL | Freq: Once | INTRAVENOUS | Status: AC
  Administered 2019-06-12: 15:00:00 500 mL via INTRAVENOUS

## 2019-06-12 MED ORDER — DEXAMETHASONE SODIUM PHOSPHATE 10 MG/ML IJ SOLN
INTRAMUSCULAR | Status: DC | PRN
  Administered 2019-06-12: 10 mg via INTRAVENOUS

## 2019-06-12 MED ORDER — IRRISEPT - 450ML BOTTLE WITH 0.05% CHG IN STERILE WATER, USP 99.95% OPTIME
TOPICAL | Status: DC | PRN
  Administered 2019-06-12: 450 mL

## 2019-06-12 MED ORDER — DEXAMETHASONE SODIUM PHOSPHATE 10 MG/ML IJ SOLN
INTRAMUSCULAR | Status: AC
  Filled 2019-06-12: qty 1

## 2019-06-12 MED ORDER — DOCUSATE SODIUM 100 MG PO CAPS: 100.0000 mg | ORAL_CAPSULE | Freq: Two times a day (BID) | ORAL | 1 refills | Status: AC

## 2019-06-12 MED ORDER — ONDANSETRON HCL 4 MG PO TABS: 4.0000 mg | ORAL_TABLET | Freq: Three times a day (TID) | ORAL | 0 refills | Status: DC | PRN

## 2019-06-12 MED ORDER — ACETAMINOPHEN 325 MG PO TABS: 325.0000 mg | ORAL_TABLET | Freq: Four times a day (QID) | ORAL | Status: DC | PRN

## 2019-06-12 MED ORDER — EPHEDRINE SULFATE-NACL 50-0.9 MG/10ML-% IV SOSY
PREFILLED_SYRINGE | INTRAVENOUS | Status: DC | PRN
  Administered 2019-06-12 (×4): 5 mg via INTRAVENOUS

## 2019-06-12 MED ORDER — SODIUM CHLORIDE (PF) 0.9 % IJ SOLN
INTRAMUSCULAR | Status: DC | PRN
  Administered 2019-06-12: 30 mL via INTRAVENOUS

## 2019-06-12 MED ORDER — POLYETHYLENE GLYCOL 3350 17 G PO PACK: 17.0000 g | PACK | Freq: Every day | ORAL | Status: DC | PRN

## 2019-06-12 MED ORDER — PROPOFOL 10 MG/ML IV BOLUS
INTRAVENOUS | Status: AC
  Filled 2019-06-12: qty 20

## 2019-06-12 MED ORDER — ONDANSETRON HCL 4 MG/2ML IJ SOLN
INTRAMUSCULAR | Status: AC
  Filled 2019-06-12: qty 2

## 2019-06-12 MED ORDER — BUPIVACAINE-EPINEPHRINE (PF) 0.5% -1:200000 IJ SOLN
INTRAMUSCULAR | Status: AC
  Filled 2019-06-12: qty 30

## 2019-06-12 MED ORDER — ALUM & MAG HYDROXIDE-SIMETH 200-200-20 MG/5ML PO SUSP: 30.0000 mL | ORAL | Status: DC | PRN

## 2019-06-12 MED ORDER — DOCUSATE SODIUM 100 MG PO CAPS: 100.0000 mg | ORAL_CAPSULE | Freq: Two times a day (BID) | ORAL | Status: DC

## 2019-06-12 MED ORDER — ONDANSETRON HCL 4 MG PO TABS: 4.0000 mg | ORAL_TABLET | Freq: Four times a day (QID) | ORAL | Status: DC | PRN

## 2019-06-12 MED ORDER — DIPHENHYDRAMINE HCL 12.5 MG/5ML PO ELIX: 12.5000 mg | ORAL_SOLUTION | ORAL | Status: DC | PRN

## 2019-06-12 MED ORDER — HYDROMORPHONE HCL 1 MG/ML IJ SOLN: 0.2500 mg | INTRAMUSCULAR | Status: DC | PRN

## 2019-06-12 MED ORDER — FENTANYL CITRATE (PF) 100 MCG/2ML IJ SOLN
INTRAMUSCULAR | Status: AC
  Filled 2019-06-12: qty 2

## 2019-06-12 MED ORDER — BUPIVACAINE HCL (PF) 0.25 % IJ SOLN
INTRAMUSCULAR | Status: AC
  Filled 2019-06-12: qty 30

## 2019-06-12 MED ORDER — DEXAMETHASONE SODIUM PHOSPHATE 10 MG/ML IJ SOLN: 10.0000 mg | Freq: Once | INTRAMUSCULAR | Status: DC

## 2019-06-12 SURGICAL SUPPLY — 54 items
BAG DECANTER FOR FLEXI CONT (MISCELLANEOUS) IMPLANT
BAG ZIPLOCK 12X15 (MISCELLANEOUS) IMPLANT
BLADE SURG SZ10 CARB STEEL (BLADE) IMPLANT
CHLORAPREP W/TINT 26 (MISCELLANEOUS) ×2 IMPLANT
COVER PERINEAL POST (MISCELLANEOUS) ×2 IMPLANT
COVER SURGICAL LIGHT HANDLE (MISCELLANEOUS) ×2 IMPLANT
COVER WAND RF STERILE (DRAPES) IMPLANT
DECANTER SPIKE VIAL GLASS SM (MISCELLANEOUS) ×2 IMPLANT
DERMABOND ADVANCED (GAUZE/BANDAGES/DRESSINGS) ×2
DERMABOND ADVANCED .7 DNX12 (GAUZE/BANDAGES/DRESSINGS) ×2 IMPLANT
DRAPE IMP U-DRAPE 54X76 (DRAPES) ×2 IMPLANT
DRAPE SHEET LG 3/4 BI-LAMINATE (DRAPES) ×6 IMPLANT
DRAPE STERI IOBAN 125X83 (DRAPES) IMPLANT
DRAPE U-SHAPE 47X51 STRL (DRAPES) ×4 IMPLANT
DRSG AQUACEL AG ADV 3.5X10 (GAUZE/BANDAGES/DRESSINGS) ×2 IMPLANT
ELECT REM PT RETURN 15FT ADLT (MISCELLANEOUS) ×2 IMPLANT
G7 VIT E NTRL LNR 36 SZG (Miscellaneous) ×2 IMPLANT
GAUZE SPONGE 4X4 12PLY STRL (GAUZE/BANDAGES/DRESSINGS) ×2 IMPLANT
GLOVE BIO SURGEON STRL SZ8.5 (GLOVE) ×4 IMPLANT
GLOVE BIOGEL PI IND STRL 8.5 (GLOVE) ×1 IMPLANT
GLOVE BIOGEL PI INDICATOR 8.5 (GLOVE) ×1
GOWN SPEC L3 XXLG W/TWL (GOWN DISPOSABLE) ×2 IMPLANT
HANDPIECE INTERPULSE COAX TIP (DISPOSABLE) ×1
HEAD CERAMIC BIOLOX 36 T1 +3 (Head) ×2 IMPLANT
HOLDER FOLEY CATH W/STRAP (MISCELLANEOUS) ×2 IMPLANT
HOOD PEEL AWAY FLYTE STAYCOOL (MISCELLANEOUS) ×8 IMPLANT
JET LAVAGE IRRISEPT WOUND (IRRIGATION / IRRIGATOR) ×2
KIT TURNOVER KIT A (KITS) IMPLANT
LAVAGE JET IRRISEPT WOUND (IRRIGATION / IRRIGATOR) ×1 IMPLANT
LINER ACETABULAR G7 60MM SZG (Liner) ×2 IMPLANT
MANIFOLD NEPTUNE II (INSTRUMENTS) ×2 IMPLANT
MARKER SKIN DUAL TIP RULER LAB (MISCELLANEOUS) ×2 IMPLANT
NDL SAFETY ECLIPSE 18X1.5 (NEEDLE) ×1 IMPLANT
NEEDLE HYPO 18GX1.5 SHARP (NEEDLE) ×1
NEEDLE SPNL 18GX3.5 QUINCKE PK (NEEDLE) ×2 IMPLANT
PACK ANTERIOR HIP CUSTOM (KITS) ×2 IMPLANT
PENCIL SMOKE EVACUATOR (MISCELLANEOUS) IMPLANT
SAW OSC TIP CART 19.5X105X1.3 (SAW) ×2 IMPLANT
SEALER BIPOLAR AQUA 6.0 (INSTRUMENTS) ×2 IMPLANT
SET HNDPC FAN SPRY TIP SCT (DISPOSABLE) ×1 IMPLANT
STEM FEMORAL TAPERLOC 13X111 (Stem) ×2 IMPLANT
SUT ETHIBOND NAB CT1 #1 30IN (SUTURE) ×4 IMPLANT
SUT MNCRL AB 3-0 PS2 18 (SUTURE) ×2 IMPLANT
SUT MNCRL AB 4-0 PS2 18 (SUTURE) ×2 IMPLANT
SUT MON AB 2-0 CT1 36 (SUTURE) ×4 IMPLANT
SUT STRATAFIX PDO 1 14 VIOLET (SUTURE) ×1
SUT STRATFX PDO 1 14 VIOLET (SUTURE) ×1
SUT VIC AB 2-0 CT1 27 (SUTURE) ×1
SUT VIC AB 2-0 CT1 TAPERPNT 27 (SUTURE) ×1 IMPLANT
SUTURE STRATFX PDO 1 14 VIOLET (SUTURE) ×1 IMPLANT
SYR 3ML LL SCALE MARK (SYRINGE) ×2 IMPLANT
TRAY FOLEY MTR SLVR 16FR STAT (SET/KITS/TRAYS/PACK) IMPLANT
WATER STERILE IRR 1000ML POUR (IV SOLUTION) ×2 IMPLANT
YANKAUER SUCT BULB TIP 10FT TU (MISCELLANEOUS) ×2 IMPLANT

## 2019-06-12 NOTE — Anesthesia Procedure Notes (Signed)
Procedure Name: MAC Date/Time: 06/12/2019 8:38 AM Performed by: Claudia Desanctis, CRNA Pre-anesthesia Checklist: Patient identified, Emergency Drugs available, Suction available and Patient being monitored Patient Re-evaluated:Patient Re-evaluated prior to induction Oxygen Delivery Method: Simple face mask

## 2019-06-12 NOTE — Transfer of Care (Signed)
Immediate Anesthesia Transfer of Care Note  Patient: Maurice Garcia  Procedure(s) Performed: TOTAL HIP ARTHROPLASTY ANTERIOR APPROACH (Right Hip)  Patient Location: PACU  Anesthesia Type:Spinal  Level of Consciousness: awake and patient cooperative  Airway & Oxygen Therapy: Patient Spontanous Breathing and Patient connected to face mask  Post-op Assessment: Report given to RN and Post -op Vital signs reviewed and stable  Post vital signs: Reviewed and stable  Last Vitals:  Vitals Value Taken Time  BP 98/61 06/12/19 1131  Temp    Pulse 63 06/12/19 1133  Resp 14 06/12/19 1133  SpO2 100 % 06/12/19 1133  Vitals shown include unvalidated device data.  Last Pain:  Vitals:   06/12/19 0648  TempSrc:   PainSc: 0-No pain      Patients Stated Pain Goal: 4 ([id number])  Complications: No apparent anesthesia complications

## 2019-06-12 NOTE — Interval H&P Note (Signed)
History and Physical Interval Note:  06/12/2019 8:11 AM  Maurice Garcia  has presented today for surgery, with the diagnosis of Degeneratice joint disease right hip.  The various methods of treatment have been discussed with the patient and family. After consideration of risks, benefits and other options for treatment, the patient has consented to  Procedure(s): TOTAL HIP ARTHROPLASTY ANTERIOR APPROACH (Right) as a surgical intervention.  The patient's history has been reviewed, patient examined, no change in status, stable for surgery.  I have reviewed the patient's chart and labs.  Questions were answered to the patient's satisfaction.     Hilton Cork Taevin Mcferran

## 2019-06-12 NOTE — Progress Notes (Signed)
Physical Therapy Treatment Patient Details Name: Maurice Garcia MRN: [id number] DOB: [birth year] Today's Date: 06/12/2019    History of Present Illness Patient is 69 y.o. s/p Rt THA on 06/12/19 with PMH significant for glaucoma, allerigies, OA, and past cervical disc repair.    PT Comments    Patient seen for additional acute PT session to progress towards goal of same day of surgery discharge. Patient continues to require min guard for transfers and gait with RW. Patient was able to ambulate ~165 feet with RW and min guard and cues for safe walker management. Patient educated on safe sequencing for stair mobility and required repeated cues for safe sequencing of stairs and walker management. Patient unable to recall exercises from initial PT evaluation and re-educated. Therapist expressed concerns for pt's memory and ability to teach family on stair mobility for safe return home, pt feels his memory is at baseline. Therapist will provide additional session for stair training with family to ensure safe technique in preparation for discharge home. Patient's BP remained stable this session and increased from 110/80 to 114/74 after gait and stair mobility. Pt instructed in exercises to for HEP. Additional session will be performed for family training to ensure safe retention of stair mobility.   Follow Up Recommendations  Follow surgeon's recommendation for DC plan and follow-up therapies     Equipment Recommendations  Rolling walker with 5" wheels    Recommendations for Other Services       Precautions / Restrictions Precautions Precautions: Fall Restrictions Weight Bearing Restrictions: No    Mobility  Bed Mobility Overal bed mobility: Needs Assistance Bed Mobility: Supine to Sit;Sit to Supine     Supine to sit: Min guard Sit to supine: Min guard   General bed mobility comments: pt able to mobilize Rt LE without assist, pt cued to deter use of bed rail, some extra time  required.  Transfers Overall transfer level: Needs assistance Equipment used: Rolling walker (2 wheeled) Transfers: Sit to/from Stand Sit to Stand: Min guard;Supervision         General transfer comment: cues for hand placement/technique with RW required. no assist needed to initiate power up. Pt reported dizziness/lightheadedness after standing. no majordrop in BP and pt reported symptoms resolved after 1 minute standing.  Ambulation/Gait Ambulation/Gait assistance: Min guard Gait Distance (Feet): 165 Feet Assistive device: Rolling walker (2 wheeled) Gait Pattern/deviations: Step-to pattern Gait velocity: fair   General Gait Details: pt requires cues for safe hand placement and step pattern in RW. Cue srequried for safe proximity and pt improved with practice. no overt LOB noted. pt denied dizziness.    Stairs Stairs: Yes Stairs assistance: Min assist Stair Management: No rails;Step to pattern;Backwards;With walker Number of Stairs: 6(3x2) General stair comments: Pt requries repeated cues for safe stair sequencing and assist to manage RW. On second bout pt unable to safely instruct RN assisting how to position the walker to assist with stair mobility. Therapist guarding for safety and provided cues to correct. Pt required repeated cues for "up with good, down with bad". Pt continued to require cues on 3rd bout for descending stairs.    Wheelchair Mobility    Modified Rankin (Stroke Patients Only)       Balance Overall balance assessment: Mild deficits observed, not formally tested             Cognition Arousal/Alertness: Awake/alert Behavior During Therapy: Impulsive Overall Cognitive Status: Within Functional Limits for tasks assessed  Exercises Total Joint Exercises Ankle Circles/Pumps: AROM;Both;15 reps;Supine Quad Sets: AROM;Right;5 reps;Supine Short Arc Quad: AROM;Right;5 reps;Supine Hip ABduction/ADduction: AROM;Right;5 reps;Supine      General Comments        Pertinent Vitals/Pain Pain Assessment: No/denies pain(just a little pressure)    Home Living Family/patient expects to be discharged to:: Private residence Living Arrangements: Spouse/significant other;Children;Other relatives Available Help at Discharge: Family Type of Home: House Home Access: Stairs to enter Entrance Stairs-Rails: None Home Layout: Two level;Able to live on main level with bedroom/bathroom;Full bath on main level Home Equipment: Grab bars - tub/shower      Prior Function Level of Independence: Independent          PT Goals (current goals can now be found in the care plan section) Acute Rehab PT Goals Patient Stated Goal: to get home today PT Goal Formulation: With patient Time For Goal Achievement: 06/19/19 Potential to Achieve Goals: Good    Frequency    7X/week      PT Plan Current plan remains appropriate    AM-PAC PT "6 Clicks" Mobility   Outcome Measure  Help needed turning from your back to your side while in a flat bed without using bedrails?: None Help needed moving from lying on your back to sitting on the side of a flat bed without using bedrails?: A Little Help needed moving to and from a bed to a chair (including a wheelchair)?: A Little Help needed standing up from a chair using your arms (e.g., wheelchair or bedside chair)?: A Little Help needed to walk in hospital room?: A Little Help needed climbing 3-5 steps with a railing? : A Little 6 Click Score: 19    End of Session Equipment Utilized During Treatment: Gait belt Activity Tolerance: Treatment limited secondary to medical complications (Comment)(orthostatic) Patient left: in bed;with call bell/phone within reach Nurse Communication: Mobility status;Other (comment)(reported vitals to pt) PT Visit Diagnosis: Muscle weakness (generalized) (M62.81);Difficulty in walking, not elsewhere classified (R26.2)     Time: [id number] PT Time Calculation (min)  (ACUTE ONLY): 32 min  Charges:  $Gait Training: 23-37 mins                     Verner Mould, DPT Physical Therapist with [facility] [phone]  06/12/2019 4:50 PM

## 2019-06-12 NOTE — Progress Notes (Addendum)
Physical Therapy Evaluation Patient Details Name: Maurice Garcia MRN: [id number] DOB: [birth year] Today's Date: 06/12/2019   History of Present Illness  Patient is 69 y.o. s/p Rt THA on 06/12/19 with PMH significant for glaucoma, allerigies, OA, and past cervical disc repair.  Clinical Impression  HILARIO CORNFORTH is a 69 y.o. male POD 0 s/p Rt THA. Patient reports independence with mobility at baseline. Patient is now limited by functional impairments (see PT problem list below) and requires min guard for bed mob and transfers with RW. Patient was limited during evaluation by orthostatic hypotension and pressure dropped from 107/70 in supine to 80/64 in standing. Gait deferred. Patient instructed in exercise to facilitate ROM and circulation. Patient will benefit from continued skilled PT interventions to address impairments and progress towards PLOF. Acute PT will follow to progress mobility and stair training in preparation for safe discharge home.     Follow Up Recommendations Follow surgeon's recommendation for DC plan and follow-up therapies    Equipment Recommendations  Rolling walker with 5" wheels    Recommendations for Other Services       Precautions / Restrictions Precautions Precautions: Fall Restrictions Weight Bearing Restrictions: No      Mobility  Bed Mobility Overal bed mobility: Needs Assistance Bed Mobility: Supine to Sit;Sit to Supine     Supine to sit: Min guard;HOB elevated Sit to supine: Min guard;HOB elevated   General bed mobility comments: pt able to mobilize Rt LE without assist, pt using bed rail, some extra time required.  Transfers Overall transfer level: Needs assistance Equipment used: Rolling walker (2 wheeled) Transfers: Sit to/from Stand Sit to Stand: Min guard         General transfer comment: cues for hand placement/technique with RW required. no assist needed to initiate power up. Pt reported dizziness/lightheadedness with  standing. Pt noted to be orthostatic and returned to sitting EOB; gait deferred.  Ambulation/Gait        Stairs            Wheelchair Mobility    Modified Rankin (Stroke Patients Only)       Balance Overall balance assessment: Mild deficits observed, not formally tested          Pertinent Vitals/Pain Pain Assessment: No/denies pain(just a little pressure)    Home Living Family/patient expects to be discharged to:: Private residence Living Arrangements: Spouse/significant other;Children;Other relatives Available Help at Discharge: Family Type of Home: House Home Access: Stairs to enter Entrance Stairs-Rails: None Entrance Stairs-Number of Steps: 3 Home Layout: Two level;Able to live on main level with bedroom/bathroom;Full bath on main level Home Equipment: Grab bars - tub/shower      Prior Function Level of Independence: Independent               Hand Dominance   Dominant Hand: Right    Extremity/Trunk Assessment   Upper Extremity Assessment Upper Extremity Assessment: Overall WFL for tasks assessed    Lower Extremity Assessment Lower Extremity Assessment: RLE deficits/detail RLE Deficits / Details: good quad activation RLE Sensation: WNL RLE Coordination: WNL    Cervical / Trunk Assessment Cervical / Trunk Assessment: Normal  Communication   Communication: No difficulties  Cognition Arousal/Alertness: Awake/alert Behavior During Therapy: Impulsive Overall Cognitive Status: Within Functional Limits for tasks assessed         General Comments      Exercises Total Joint Exercises Ankle Circles/Pumps: AROM;Both;15 reps;Supine Heel Slides: AROM;Right;10 reps;Supine Long Arc Quad: AROM;Right;10 reps;Seated   Assessment/Plan  PT Assessment Patient needs continued PT services  PT Problem List Decreased strength;Decreased range of motion;Decreased activity tolerance;Decreased balance;Decreased mobility;Decreased knowledge of use of  DME;Decreased knowledge of precautions       PT Treatment Interventions DME instruction;Gait training;Stair training;Functional mobility training;Therapeutic activities;Therapeutic exercise;Balance training;Patient/family education    PT Goals (Current goals can be found in the Care Plan section)  Acute Rehab PT Goals Patient Stated Goal: to get home today PT Goal Formulation: With patient Time For Goal Achievement: 06/19/19 Potential to Achieve Goals: Good    Frequency 7X/week    AM-PAC PT "6 Clicks" Mobility  Outcome Measure Help needed turning from your back to your side while in a flat bed without using bedrails?: None Help needed moving from lying on your back to sitting on the side of a flat bed without using bedrails?: A Little Help needed moving to and from a bed to a chair (including a wheelchair)?: A Little Help needed standing up from a chair using your arms (e.g., wheelchair or bedside chair)?: A Little Help needed to walk in hospital room?: A Little Help needed climbing 3-5 steps with a railing? : A Lot 6 Click Score: 18    End of Session Equipment Utilized During Treatment: Gait belt Activity Tolerance: Treatment limited secondary to medical complications (Comment)(orthostatic) Patient left: in bed;with call bell/phone within reach Nurse Communication: Mobility status;Other (comment)(reported vitals to pt) PT Visit Diagnosis: Muscle weakness (generalized) (M62.81);Difficulty in walking, not elsewhere classified (R26.2)    Time: [id number] PT Time Calculation (min) (ACUTE ONLY): 23 min   Charges:   PT Evaluation $PT Eval Low Complexity: 1 Low PT Treatments $Therapeutic Exercise: 8-22 mins        Verner Mould, DPT Physical Therapist with [facility] [phone]  06/12/2019 4:37 PM

## 2019-06-12 NOTE — Progress Notes (Signed)
Physical Therapy Treatment Patient Details Name: Maurice Garcia MRN: [id number] DOB: Feb 15, 1951 Today's Date: 06/12/2019    History of Present Illness Patient is 69 y.o. s/p Rt THA on 06/12/19 with PMH significant for glaucoma, allerigies, OA, and past cervical disc repair.    PT Comments    Patient seen for additional therapy session with wife present for stair training. Pt demonstrated good carryover for all transfers and gait with RW. Pt's wife instructed in safe guarding technique and demonstrated competence. Pt was able to instruct spouse in safe guarding and assist for stair mobility and had good carryover for safe technique with no overt LOB. Pt has met all mobility goals at safe level for discharge home, acute PT will follow and progress towards Mod Independent goals in acute setting.   Follow Up Recommendations  Follow surgeon's recommendation for DC plan and follow-up therapies     Equipment Recommendations  Rolling walker with 5" wheels    Recommendations for Other Services       Precautions / Restrictions Precautions Precautions: Fall Restrictions Weight Bearing Restrictions: No    Mobility  Bed Mobility Overal bed mobility: Needs Assistance Bed Mobility: Supine to Sit;Sit to Supine     Supine to sit: Supervision Sit to supine: Supervision   General bed mobility comments: pt continues to requrie some increased time.  Transfers Overall transfer level: Needs assistance Equipment used: Rolling walker (2 wheeled) Transfers: Sit to/from Stand Sit to Stand: Min guard;Supervision         General transfer comment: pt with good carryover for hand placement, no assist required. Pt's wife demonstrated safe guarding position for transfer with RW.  Ambulation/Gait Ambulation/Gait assistance: Min guard Gait Distance (Feet): 40 Feet Assistive device: Rolling walker (2 wheeled) Gait Pattern/deviations: Step-to pattern Gait velocity: fair   General Gait Details:  pt with good carryover for safe hand placement, step pattern, and proximity to RW. no overt LOB. Pt's wife demonstrated safe guarding position to patient.   Stairs Stairs: Yes Stairs assistance: Min guard Stair Management: No rails;Step to pattern;Backwards;With walker Number of Stairs: 4(2x2) General stair comments: Pt with good carryover and pt's wife present for teaching. Pt able to instruct wife in safe guarding position to assist with stair mobility. Pt recalled safe step pattern "up with good down with bad" with no cues.    Wheelchair Mobility    Modified Rankin (Stroke Patients Only)       Balance Overall balance assessment: Mild deficits observed, not formally tested           Cognition Arousal/Alertness: Awake/alert Behavior During Therapy: Impulsive Overall Cognitive Status: Within Functional Limits for tasks assessed           Exercises Total Joint Exercises Quad Sets: AROM;Right;5 reps;Supine Short Arc Quad: AROM;Right;5 reps;Supine Hip ABduction/ADduction: AROM;Right;5 reps;Supine    General Comments        Pertinent Vitals/Pain Pain Assessment: No/denies pain           PT Goals (current goals can now be found in the care plan section) Acute Rehab PT Goals Patient Stated Goal: to get home today PT Goal Formulation: With patient Time For Goal Achievement: 06/19/19 Potential to Achieve Goals: Good    Frequency    7X/week      PT Plan Current plan remains appropriate    Co-evaluation              AM-PAC PT "6 Clicks" Mobility   Outcome Measure  Help needed turning from your back  to your side while in a flat bed without using bedrails?: None Help needed moving from lying on your back to sitting on the side of a flat bed without using bedrails?: A Little Help needed moving to and from a bed to a chair (including a wheelchair)?: A Little Help needed standing up from a chair using your arms (e.g., wheelchair or bedside chair)?: A  Little Help needed to walk in hospital room?: A Little Help needed climbing 3-5 steps with a railing? : A Little 6 Click Score: 19    End of Session Equipment Utilized During Treatment: Gait belt Activity Tolerance: Patient tolerated treatment well Patient left: in bed;with call bell/phone within reach Nurse Communication: Mobility status PT Visit Diagnosis: Muscle weakness (generalized) (M62.81);Difficulty in walking, not elsewhere classified (R26.2)     Time: 6808-8110 PT Time Calculation (min) (ACUTE ONLY): 22 min  Charges:  $Gait Training: 8-22 mins                    Verner Mould, DPT Physical Therapist with [facility] [phone]  06/12/2019 7:20 PM

## 2019-06-12 NOTE — Anesthesia Postprocedure Evaluation (Signed)
Anesthesia Post Note  Patient: Maurice Garcia  Procedure(s) Performed: TOTAL HIP ARTHROPLASTY ANTERIOR APPROACH (Right Hip)     Patient location during evaluation: PACU Anesthesia Type: Spinal Level of consciousness: oriented and awake and alert Pain management: pain level controlled Vital Signs Assessment: post-procedure vital signs reviewed and stable Respiratory status: spontaneous breathing, respiratory function stable and nonlabored ventilation Cardiovascular status: blood pressure returned to baseline and stable Postop Assessment: no headache, no backache, no apparent nausea or vomiting and spinal receding Anesthetic complications: no    Last Vitals:  Vitals:   06/12/19 1145 06/12/19 1200  BP: 106/65 101/63  Pulse: 69 63  Resp: 12 14  Temp:    SpO2: 100% 100%    Last Pain:  Vitals:   06/12/19 1200  TempSrc:   PainSc: 0-No pain                 Maddix Kliewer A.

## 2019-06-12 NOTE — Discharge Instructions (Signed)
Arthritis Arthritis is a term that is commonly used to refer to joint pain or joint disease. There are more than 100 types of arthritis. What are the causes? The most common cause of this condition is wear and tear of a joint. Other causes include:  Gout.  Inflammation of a joint.  An infection of a joint.  Sprains and other injuries near the joint.  A reaction to medicines or drugs, or an allergic reaction. In some cases, the cause may not be known. What are the signs or symptoms? The main symptom of this condition is pain in the joint during movement. Other symptoms include:  Redness, swelling, or stiffness at a joint.  Warmth coming from the joint.  Fever.  Overall feeling of illness. How is this diagnosed? This condition may be diagnosed with a physical exam and tests, including:  Blood tests.  Urine tests.  Imaging tests, such as X-rays, an MRI, or a CT scan. Sometimes, fluid is removed from a joint for testing. How is this treated? This condition may be treated with:  Treatment of the cause, if it is known.  Rest.  Raising (elevating) the joint.  Applying cold or hot packs to the joint.  Medicines to improve symptoms and reduce inflammation.  Injections of a steroid such as cortisone into the joint to help reduce pain and inflammation. Depending on the cause of your arthritis, you may need to make lifestyle changes to reduce stress on your joint. Changes may include:  Exercising more.  Losing weight. Follow these instructions at home: Medicines  Take over-the-counter and prescription medicines only as told by your health care provider.  Do not take aspirin to relieve pain if your health care provider thinks that gout may be causing your pain. Activity  Rest your joint if told by your health care provider. Rest is important when your disease is active and your joint feels painful, swollen, or stiff.  Avoid activities that make the pain worse. It  is important to balance activity with rest.  Exercise your joint regularly with range-of-motion exercises as told by your health care provider. Try doing low-impact exercise, such as: ? Swimming. ? Water aerobics. ? Biking. ? Walking. Managing pain, stiffness, and swelling      If directed, put ice on the joint. ? Put ice in a plastic bag. ? Place a towel between your skin and the bag. ? Leave the ice on for 20 minutes, 2-3 times per day.  If your joint is swollen, raise (elevate) it above the level of your heart if directed by your health care provider.  If your joint feels stiff in the morning, try taking a warm shower.  If directed, apply heat to the affected area as often as told by your health care provider. Use the heat source that your health care provider recommends, such as a moist heat pack or a heating pad. If you have diabetes, do not apply heat without permission from your health care provider. To apply heat: ? Place a towel between your skin and the heat source. ? Leave the heat on for 20-30 minutes. ? Remove the heat if your skin turns bright red. This is especially important if you are unable to feel pain, heat, or cold. You may have a greater risk of getting burned. General instructions  Do not use any products that contain nicotine or tobacco, such as cigarettes, e-cigarettes, and chewing tobacco. If you need help quitting, ask your health care provider.  Keep all follow-up visits as told by your health care provider. This is important. Contact a health care provider if:  The pain gets worse.  You have a fever. Get help right away if:  You develop severe joint pain, swelling, or redness.  Many joints become painful and swollen.  You develop severe back pain.  You develop severe weakness in your leg.  You cannot control your bladder or bowels. Summary  Arthritis is a term that is commonly used to refer to joint pain or joint disease. There are more  than 100 types of arthritis.  The most common cause of this condition is wear and tear of a joint. Other causes include gout, inflammation or infection of the joint, sprains, or allergies.  Symptoms of this condition include redness, swelling, or stiffness of the joint. Other symptoms include warmth, fever, or feeling ill.  This condition is treated with rest, elevation, medicines, and applying cold or hot packs.  Follow your health care provider's instructions about medicines, activity, exercises, and other home care treatments. This information is not intended to replace advice given to you by your health care provider. Make sure you discuss any questions you have with your health care provider. Document Revised: 01/22/2018 Document Reviewed: 01/22/2018 Elsevier Patient Education  2020 [place] Anesthesia, Adult, Care After This sheet gives you information about how to care for yourself after your procedure. Your health care provider may also give you more specific instructions. If you have problems or questions, contact your health care provider. What can I expect after the procedure? After the procedure, the following side effects are common:  Pain or discomfort at the IV site.  Nausea.  Vomiting.  Sore throat.  Trouble concentrating.  Feeling cold or chills.  Weak or tired.  Sleepiness and fatigue.  Soreness and body aches. These side effects can affect parts of the body that were not involved in surgery. Follow these instructions at home:  For at least 24 hours after the procedure:  Have a responsible adult stay with you. It is important to have someone help care for you until you are awake and alert.  Rest as needed.  Do not: ? Participate in activities in which you could fall or become injured. ? Drive. ? Use heavy machinery. ? Drink alcohol. ? Take sleeping pills or medicines that cause drowsiness. ? Make important decisions or sign legal  documents. ? Take care of children on your own. Eating and drinking  Follow any instructions from your health care provider about eating or drinking restrictions.  When you feel hungry, start by eating small amounts of foods that are soft and easy to digest (bland), such as toast. Gradually return to your regular diet.  Drink enough fluid to keep your urine pale yellow.  If you vomit, rehydrate by drinking water, juice, or clear broth. General instructions  If you have sleep apnea, surgery and certain medicines can increase your risk for breathing problems. Follow instructions from your health care provider about wearing your sleep device: ? Anytime you are sleeping, including during daytime naps. ? While taking prescription pain medicines, sleeping medicines, or medicines that make you drowsy.  Return to your normal activities as told by your health care provider. Ask your health care provider what activities are safe for you.  Take over-the-counter and prescription medicines only as told by your health care provider.  If you smoke, do not smoke without supervision.  Keep all follow-up visits as told by your  health care provider. This is important. Contact a health care provider if:  You have nausea or vomiting that does not get better with medicine.  You cannot eat or drink without vomiting.  You have pain that does not get better with medicine.  You are unable to pass urine.  You develop a skin rash.  You have a fever.  You have redness around your IV site that gets worse. Get help right away if:  You have difficulty breathing.  You have chest pain.  You have blood in your urine or stool, or you vomit blood. Summary  After the procedure, it is common to have a sore throat or nausea. It is also common to feel tired.  Have a responsible adult stay with you for the first 24 hours after general anesthesia. It is important to have someone help care for you until you  are awake and alert.  When you feel hungry, start by eating small amounts of foods that are soft and easy to digest (bland), such as toast. Gradually return to your regular diet.  Drink enough fluid to keep your urine pale yellow.  Return to your normal activities as told by your health care provider. Ask your health care provider what activities are safe for you. This information is not intended to replace advice given to you by your health care provider. Make sure you discuss any questions you have with your health care provider. Document Revised: 02/17/2017 Document Reviewed: 09/30/2016 Elsevier Patient Education  [employer].

## 2019-06-12 NOTE — Op Note (Signed)
OPERATIVE REPORT  SURGEON: Rod Can, MD   ASSISTANT: Nehemiah Massed, PA-C.  PREOPERATIVE DIAGNOSIS: Right hip arthritis.   POSTOPERATIVE DIAGNOSIS: Right hip arthritis.   PROCEDURE: Right total hip arthroplasty, anterior approach.   IMPLANTS: Biomet Taperloc Complete Microplasty stem, size 13 x 111, high offset, Type 1 taper. Biomet G7 Cup, size 60 mm. Biomet E1 liner, size 36 mm, G, neutral. Biomet Biolox ceramic head ball, size 36 + 3 mm.  ANESTHESIA:  MAC and Spinal  ESTIMATED BLOOD LOSS:-600 mL    ANTIBIOTICS: 2 g Ancef.  DRAINS: None.  COMPLICATIONS: None.   CONDITION: PACU - hemodynamically stable.   BRIEF CLINICAL NOTE: Maurice Garcia is a 69 y.o. male with a long-standing history of Right hip arthritis. After failing conservative management, the patient was indicated for total hip arthroplasty. The risks, benefits, and alternatives to the procedure were explained, and the patient elected to proceed.  PROCEDURE IN DETAIL: Surgical site was marked by myself in the pre-op holding area. Once inside the operating room, spinal anesthesia was obtained, and a foley catheter was inserted. The patient was then positioned on the Hana table.  All bony prominences were well padded.  The hip was prepped and draped in the normal sterile surgical fashion.  A time-out was called verifying side and site of surgery. The patient received IV antibiotics within 60 minutes of beginning the procedure.   The direct anterior approach to the hip was performed through the Hueter interval.  Lateral femoral circumflex vessels were treated with the Auqumantys. The anterior capsule was exposed and an inverted T capsulotomy was made. The femoral neck cut was made to the level of the templated cut.  A corkscrew was placed into the head and the head was removed.  The femoral head was found to have eburnated bone. The head was passed to the back table and was measured.   Acetabular exposure was  achieved, and the pulvinar and labrum were excised. Sequential reaming of the acetabulum was then performed up to a size 59 mm reamer. A 60 mm cup was then opened and impacted into place at approximately 40 degrees of abduction and 20 degrees of anteversion. The trial polyethylene liner was impacted into place and acetabular osteophytes were removed.    I then gained femoral exposure taking care to protect the abductors and greater trochanter.  This was performed using standard external rotation, extension, and adduction.  The capsule was peeled off the inner aspect of the greater trochanter, taking care to preserve the short external rotators. A cookie cutter was used to enter the femoral canal, and then the femoral canal finder was placed.  Sequential broaching was performed up to a size 13.  Calcar planer was used on the femoral neck remnant.  I placed a high offset neck and a trial head ball.  The hip was reduced.  Leg lengths and offset were checked fluoroscopically.  The hip was dislocated and trial components were removed.  Hole eliminator was placed. The final implants were placed, and the hip was reduced.  Fluoroscopy was used to confirm component position and leg lengths.  At 90 degrees of external rotation and full extension, the hip was stable to an anterior directed force.   The wound was copiously irrigated with Irrisept solution and normal saline using pule lavage.  Marcaine solution was injected into the periarticular soft tissue.  The wound was closed in layers using #1 Vicryl and V-Loc for the fascia, 2-0 Vicryl for the subcutaneous fat,  2-0 Monocryl for the deep dermal layer, 3-0 running Monocryl subcuticular stitch, and Dermabond for the skin.  Once the glue was fully dried, an Aquacell Ag dressing was applied.  The patient was transported to the recovery room in stable condition.  Sponge, needle, and instrument counts were correct at the end of the case x2.  The patient tolerated the  procedure well and there were no known complications.  Please note that a surgical assistant was a medical necessity for this procedure to perform it in a safe and expeditious manner. Assistant was necessary to provide appropriate retraction of vital neurovascular structures, to prevent femoral fracture, and to allow for anatomic placement of the prosthesis.

## 2019-06-12 NOTE — Anesthesia Procedure Notes (Addendum)
Spinal  Patient location during procedure: OR Start time: 06/12/2019 8:28 AM End time: 06/12/2019 8:31 AM Staffing Performed: resident/CRNA  Resident/CRNA: Claudia Desanctis, CRNA Preanesthetic Checklist Completed: patient identified, IV checked, site marked, risks and benefits discussed, surgical consent, monitors and equipment checked, pre-op evaluation and timeout performed Spinal Block Patient position: sitting Prep: DuraPrep Patient monitoring: heart rate, cardiac monitor, continuous pulse ox and blood pressure Approach: midline Location: L3-4 Injection technique: single-shot Needle Needle type: Sprotte and Pencan  Needle gauge: 24 G Needle length: 10 cm Needle insertion depth: 9 cm Assessment Sensory level: T4

## 2019-06-13 ENCOUNTER — Encounter: Payer: Self-pay | Admitting: *Deleted

## 2019-06-13 DIAGNOSIS — M1611 Unilateral primary osteoarthritis, right hip: Secondary | ICD-10-CM | POA: Diagnosis not present

## 2019-06-27 DIAGNOSIS — Z471 Aftercare following joint replacement surgery: Secondary | ICD-10-CM | POA: Diagnosis not present

## 2019-06-27 DIAGNOSIS — Z96641 Presence of right artificial hip joint: Secondary | ICD-10-CM | POA: Diagnosis not present

## 2019-07-12 DIAGNOSIS — D1801 Hemangioma of skin and subcutaneous tissue: Secondary | ICD-10-CM | POA: Diagnosis not present

## 2019-07-12 DIAGNOSIS — D225 Melanocytic nevi of trunk: Secondary | ICD-10-CM | POA: Diagnosis not present

## 2019-07-12 DIAGNOSIS — L821 Other seborrheic keratosis: Secondary | ICD-10-CM | POA: Diagnosis not present

## 2019-07-12 DIAGNOSIS — L570 Actinic keratosis: Secondary | ICD-10-CM | POA: Diagnosis not present

## 2019-07-12 DIAGNOSIS — L814 Other melanin hyperpigmentation: Secondary | ICD-10-CM | POA: Diagnosis not present

## 2019-07-22 DIAGNOSIS — R69 Illness, unspecified: Secondary | ICD-10-CM | POA: Diagnosis not present

## 2019-07-23 DIAGNOSIS — Z96641 Presence of right artificial hip joint: Secondary | ICD-10-CM | POA: Diagnosis not present

## 2019-07-23 DIAGNOSIS — Z471 Aftercare following joint replacement surgery: Secondary | ICD-10-CM | POA: Diagnosis not present

## 2019-07-31 DIAGNOSIS — I38 Endocarditis, valve unspecified: Secondary | ICD-10-CM | POA: Diagnosis not present

## 2019-07-31 DIAGNOSIS — Z789 Other specified health status: Secondary | ICD-10-CM | POA: Diagnosis not present

## 2019-07-31 DIAGNOSIS — R7309 Other abnormal glucose: Secondary | ICD-10-CM | POA: Diagnosis not present

## 2019-08-16 DIAGNOSIS — I38 Endocarditis, valve unspecified: Secondary | ICD-10-CM | POA: Diagnosis not present

## 2019-08-16 DIAGNOSIS — I060 Rheumatic aortic stenosis: Secondary | ICD-10-CM | POA: Diagnosis not present

## 2019-09-11 ENCOUNTER — Other Ambulatory Visit: Payer: Medicare HMO

## 2019-09-12 DIAGNOSIS — Z20822 Contact with and (suspected) exposure to covid-19: Secondary | ICD-10-CM | POA: Diagnosis not present

## 2019-10-17 DIAGNOSIS — G4700 Insomnia, unspecified: Secondary | ICD-10-CM | POA: Diagnosis not present

## 2019-10-17 DIAGNOSIS — N400 Enlarged prostate without lower urinary tract symptoms: Secondary | ICD-10-CM | POA: Diagnosis not present

## 2019-10-17 DIAGNOSIS — H409 Unspecified glaucoma: Secondary | ICD-10-CM | POA: Diagnosis not present

## 2019-11-06 DIAGNOSIS — H6502 Acute serous otitis media, left ear: Secondary | ICD-10-CM | POA: Diagnosis not present

## 2019-11-25 DIAGNOSIS — H401132 Primary open-angle glaucoma, bilateral, moderate stage: Secondary | ICD-10-CM | POA: Diagnosis not present

## 2019-11-25 DIAGNOSIS — H35371 Puckering of macula, right eye: Secondary | ICD-10-CM | POA: Diagnosis not present

## 2019-11-25 DIAGNOSIS — H26491 Other secondary cataract, right eye: Secondary | ICD-10-CM | POA: Diagnosis not present

## 2019-11-28 DIAGNOSIS — G4700 Insomnia, unspecified: Secondary | ICD-10-CM | POA: Diagnosis not present

## 2019-11-28 DIAGNOSIS — N400 Enlarged prostate without lower urinary tract symptoms: Secondary | ICD-10-CM | POA: Diagnosis not present

## 2019-11-28 DIAGNOSIS — H409 Unspecified glaucoma: Secondary | ICD-10-CM | POA: Diagnosis not present

## 2019-12-17 ENCOUNTER — Encounter (INDEPENDENT_AMBULATORY_CARE_PROVIDER_SITE_OTHER): Payer: Self-pay | Admitting: Otolaryngology

## 2019-12-17 ENCOUNTER — Other Ambulatory Visit: Payer: Self-pay

## 2019-12-17 ENCOUNTER — Ambulatory Visit (INDEPENDENT_AMBULATORY_CARE_PROVIDER_SITE_OTHER): Payer: Medicare HMO | Admitting: Otolaryngology

## 2019-12-17 VITALS — Temp 97.2°F

## 2019-12-17 DIAGNOSIS — H60312 Diffuse otitis externa, left ear: Secondary | ICD-10-CM

## 2019-12-17 NOTE — Progress Notes (Signed)
HPI: Maurice Garcia is a 69 y.o. male who presents is referred by [facility] for evaluation of left ear problems.  Patient has bad hearing in both ears and wears hearing aids most of the time or he is unable to hear.  Starting the first part of September he was having some mild discomfort and drainage from the left ear.  He was treated with antibiotic on September 8.  He still feels like there is some drainage in the left ear..  Past Medical History:  Diagnosis Date  . Diverticulosis   . Glaucoma   . Pre-diabetes   . Seasonal allergies    Past Surgical History:  Procedure Laterality Date  . cervical disc repaired    . EYE SURGERY     cataract surgery bilat   . TOTAL HIP ARTHROPLASTY Right 06/12/2019   Procedure: TOTAL HIP ARTHROPLASTY ANTERIOR APPROACH;  Surgeon: Rod Can, MD;  Location: [facility];  Service: Orthopedics;  Laterality: Right;   Social History   Socioeconomic History  . Marital status: Married    Spouse name: Not on file  . Number of children: Not on file  . Years of education: Not on file  . Highest education level: Not on file  Occupational History  . Not on file  Tobacco Use  . Smoking status: Never Smoker  . Smokeless tobacco: Never Used  Vaping Use  . Vaping Use: Never used  Substance and Sexual Activity  . Alcohol use: Never  . Drug use: Never  . Sexual activity: Not on file  Other Topics Concern  . Not on file  Social History Narrative  . Not on file   Social Determinants of Health   Financial Resource Strain:   . Difficulty of Paying Living Expenses: Not on file  Food Insecurity:   . Worried About Charity fundraiser in the Last Year: Not on file  . Ran Out of Food in the Last Year: Not on file  Transportation Needs:   . Lack of Transportation (Medical): Not on file  . Lack of Transportation (Non-Medical): Not on file  Physical Activity:   . Days of Exercise per Week: Not on file  . Minutes of Exercise per Session: Not on file   Stress:   . Feeling of Stress : Not on file  Social Connections:   . Frequency of Communication with Friends and Family: Not on file  . Frequency of Social Gatherings with Friends and Family: Not on file  . Attends Religious Services: Not on file  . Active Member of Clubs or Organizations: Not on file  . Attends Archivist Meetings: Not on file  . Marital Status: Not on file   No family history on file. No Known Allergies Prior to Admission medications   Medication Sig Start Date End Date Taking? Authorizing Provider  5-Hydroxytryptophan (5-HTP) 100 MG CAPS Take 100 mg by mouth at bedtime.   Yes [provider]  Brimonidine Tartrate (LUMIFY) 0.025 % SOLN Place 1 drop into both eyes in the morning and at bedtime.   Yes [provider]  Calcium-Magnesium-Zinc (CAL-MAG-ZINC PO) Take 1 tablet by mouth daily.   Yes [provider]  colchicine 0.6 MG tablet Take 0.6 mg by mouth daily as needed (gout flare).   Yes [provider]  diphenhydrAMINE-zinc acetate (BENADRYL) cream Apply 1 application topically daily as needed (poison ivy).   Yes [provider]  famotidine-calcium carbonate-magnesium hydroxide (PEPCID COMPLETE) 10-800-165 MG chewable tablet Chew 1  tablet by mouth daily as needed (acid reflux).   Yes [provider]  Garlic 1324 MG CAPS Take 2,000 mg by mouth daily.   Yes [provider]  HYDROcodone-acetaminophen (NORCO) 5-325 MG tablet Take 1 tablet by mouth every 4 (four) hours as needed for moderate pain. 06/12/19  Yes Swinteck, Aaron Edelman, MD  latanoprost (XALATAN) 0.005 % ophthalmic solution Place 1 drop into both eyes at bedtime.   Yes [provider]  Melatonin 5 MG CAPS Take 5 mg by mouth at bedtime.   Yes [provider]  Multiple Vitamins-Minerals (CENTRUM SILVER PO) Take 1 tablet by mouth daily.   Yes [provider]  naproxen sodium (ALEVE) 220 MG tablet Take 440 mg by mouth in  the morning and at bedtime.   Yes [provider]  ondansetron (ZOFRAN) 4 MG tablet Take 1 tablet (4 mg total) by mouth every 8 (eight) hours as needed for nausea or vomiting. 06/12/19  Yes Swinteck, Aaron Edelman, MD  OVER THE COUNTER MEDICATION Take 3 tablets by mouth daily. Heal and soothe otc supplement   Yes [provider]  Propylene Glycol-Glycerin (SOOTHE OP) Place 1 drop into both eyes daily as needed (dry eyes).   Yes [provider]  Pseudoeph-Doxylamine-DM-APAP (NYQUIL PO) Take 30 mLs by mouth at bedtime as needed (allergies).   Yes [provider]  pseudoephedrine (SUDAFED) 30 MG tablet Take 60 mg by mouth daily as needed for congestion.   Yes [provider]  Pseudoephedrine-APAP-DM (DAYQUIL PO) Take 30 mLs by mouth 2 (two) times daily as needed (allergies).   Yes [provider]  PSYLLIUM HUSK PO Take 2 capsules by mouth daily.   Yes [provider]  rOPINIRole (REQUIP) 0.5 MG tablet Take 0.5 mg by mouth at bedtime as needed (restless legs).   Yes [provider]  tamsulosin (FLOMAX) 0.4 MG CAPS capsule Take 0.4 mg by mouth every evening.   Yes [provider]  zolpidem (AMBIEN) 10 MG tablet Take 5 mg by mouth at bedtime as needed for sleep.   Yes [provider]     Positive ROS: Otherwise negative  All other systems have been reviewed and were otherwise negative with the exception of those mentioned in the HPI and as above.  Physical Exam: Constitutional: Alert, well-appearing, no acute distress Ears: External ears without lesions or tenderness.  On microscopic exam he has mild left external otitis that was cleaned with hydroperoxide and suction.  After cleaning the ear I applied gentian violet and CSF powder to the left ear.  The TM was intact and clear otherwise.  On the right side he had minimal wax buildup and no significant external otitis with a clear TM with good mobility on pneumatic  otoscopy.  I did apply CSF powder to the right ear. Nasal: External nose without lesions. Clear nasal passages Oral: Lips and gums without lesions. Tongue and palate mucosa without lesions. Posterior oropharynx clear. Neck: No palpable adenopathy or masses Respiratory: Breathing comfortably  Skin: No facial/neck lesions or rash noted.  Binocular microscopy  Date/Time: 12/17/2019 3:02 PM Performed by: Rozetta Nunnery, MD Authorized by: Rozetta Nunnery, MD   Consent:    Consent obtained:  Verbal   Consent given by:  Patient   Alternatives discussed:  No treatment Procedure details:    Indications: otitis externa     Scope location: bilateral     Right speculum size:  4 Cerumen:    Right ear: normal quantity of  cerumen   Right ear canal:    normal   Right tympanic membrane:    Mobility: fully mobile   Left ear canal:    drainage and erythema   Left tympanic membrane:    Mobility: fully mobile   Post-procedure details:    Patient tolerance of procedure:  Tolerated well, no immediate complications Comments:     On microscopic exam patient had a mild left external otitis.  Ear canal was cleaned and I applied gentian violet and CSF powder to the left ear.  The right ear canal and TM were clear but I applied CSF powder to the right ear.    Assessment: Mild left external otitis. Sensorineural hearing loss.  Patient wears bilateral hearing aids.  Plan: Recommended leaving the hearing aids out as much as possible. Discussed with him concerning trying to keep the ears dry and use of alcohol vinegar ear rinses if he has any further drainage or discomfort in the ear. If he has any persistent drainage I prescribed Cortisporin otic suspension drops to use 4 drops 3 times daily for 5 days. He will follow-up as needed.   Radene Journey, MD   CC:

## 2020-01-05 DIAGNOSIS — R69 Illness, unspecified: Secondary | ICD-10-CM | POA: Diagnosis not present

## 2020-01-08 DIAGNOSIS — R69 Illness, unspecified: Secondary | ICD-10-CM | POA: Diagnosis not present

## 2020-05-20 DIAGNOSIS — J309 Allergic rhinitis, unspecified: Secondary | ICD-10-CM | POA: Diagnosis not present

## 2020-05-20 DIAGNOSIS — R059 Cough, unspecified: Secondary | ICD-10-CM | POA: Diagnosis not present

## 2020-05-29 DIAGNOSIS — H6981 Other specified disorders of Eustachian tube, right ear: Secondary | ICD-10-CM | POA: Diagnosis not present

## 2020-06-01 DIAGNOSIS — N400 Enlarged prostate without lower urinary tract symptoms: Secondary | ICD-10-CM | POA: Diagnosis not present

## 2020-06-01 DIAGNOSIS — G4700 Insomnia, unspecified: Secondary | ICD-10-CM | POA: Diagnosis not present

## 2020-06-01 DIAGNOSIS — H409 Unspecified glaucoma: Secondary | ICD-10-CM | POA: Diagnosis not present

## 2020-06-02 DIAGNOSIS — H401132 Primary open-angle glaucoma, bilateral, moderate stage: Secondary | ICD-10-CM | POA: Diagnosis not present

## 2020-07-02 DIAGNOSIS — Z1389 Encounter for screening for other disorder: Secondary | ICD-10-CM | POA: Diagnosis not present

## 2020-07-02 DIAGNOSIS — N400 Enlarged prostate without lower urinary tract symptoms: Secondary | ICD-10-CM | POA: Diagnosis not present

## 2020-07-02 DIAGNOSIS — Z125 Encounter for screening for malignant neoplasm of prostate: Secondary | ICD-10-CM | POA: Diagnosis not present

## 2020-07-02 DIAGNOSIS — J309 Allergic rhinitis, unspecified: Secondary | ICD-10-CM | POA: Diagnosis not present

## 2020-07-02 DIAGNOSIS — Z136 Encounter for screening for cardiovascular disorders: Secondary | ICD-10-CM | POA: Diagnosis not present

## 2020-07-02 DIAGNOSIS — Z6832 Body mass index (BMI) 32.0-32.9, adult: Secondary | ICD-10-CM | POA: Diagnosis not present

## 2020-07-02 DIAGNOSIS — R739 Hyperglycemia, unspecified: Secondary | ICD-10-CM | POA: Diagnosis not present

## 2020-07-02 DIAGNOSIS — Z0000 Encounter for general adult medical examination without abnormal findings: Secondary | ICD-10-CM | POA: Diagnosis not present

## 2020-07-02 DIAGNOSIS — M25551 Pain in right hip: Secondary | ICD-10-CM | POA: Diagnosis not present

## 2020-07-02 DIAGNOSIS — E669 Obesity, unspecified: Secondary | ICD-10-CM | POA: Diagnosis not present

## 2020-07-02 DIAGNOSIS — G4700 Insomnia, unspecified: Secondary | ICD-10-CM | POA: Diagnosis not present

## 2020-08-03 DIAGNOSIS — R7303 Prediabetes: Secondary | ICD-10-CM | POA: Diagnosis not present

## 2020-08-03 DIAGNOSIS — R7309 Other abnormal glucose: Secondary | ICD-10-CM | POA: Diagnosis not present

## 2020-09-28 DIAGNOSIS — H6983 Other specified disorders of Eustachian tube, bilateral: Secondary | ICD-10-CM | POA: Diagnosis not present

## 2020-10-08 DIAGNOSIS — G4700 Insomnia, unspecified: Secondary | ICD-10-CM | POA: Diagnosis not present

## 2020-10-08 DIAGNOSIS — N400 Enlarged prostate without lower urinary tract symptoms: Secondary | ICD-10-CM | POA: Diagnosis not present

## 2020-10-08 DIAGNOSIS — H409 Unspecified glaucoma: Secondary | ICD-10-CM | POA: Diagnosis not present

## 2020-11-13 DIAGNOSIS — H6990 Unspecified Eustachian tube disorder, unspecified ear: Secondary | ICD-10-CM | POA: Diagnosis not present

## 2020-11-30 DIAGNOSIS — H11042 Peripheral pterygium, stationary, left eye: Secondary | ICD-10-CM | POA: Diagnosis not present

## 2020-11-30 DIAGNOSIS — H35371 Puckering of macula, right eye: Secondary | ICD-10-CM | POA: Diagnosis not present

## 2020-11-30 DIAGNOSIS — H43813 Vitreous degeneration, bilateral: Secondary | ICD-10-CM | POA: Diagnosis not present

## 2020-11-30 DIAGNOSIS — H401132 Primary open-angle glaucoma, bilateral, moderate stage: Secondary | ICD-10-CM | POA: Diagnosis not present

## 2020-12-10 DIAGNOSIS — H6590 Unspecified nonsuppurative otitis media, unspecified ear: Secondary | ICD-10-CM | POA: Diagnosis not present

## 2020-12-10 DIAGNOSIS — T753XXA Motion sickness, initial encounter: Secondary | ICD-10-CM | POA: Diagnosis not present

## 2020-12-10 DIAGNOSIS — H6092 Unspecified otitis externa, left ear: Secondary | ICD-10-CM | POA: Diagnosis not present

## 2020-12-17 DIAGNOSIS — M79641 Pain in right hand: Secondary | ICD-10-CM | POA: Diagnosis not present

## 2020-12-17 DIAGNOSIS — H6590 Unspecified nonsuppurative otitis media, unspecified ear: Secondary | ICD-10-CM | POA: Diagnosis not present

## 2020-12-17 DIAGNOSIS — H6092 Unspecified otitis externa, left ear: Secondary | ICD-10-CM | POA: Diagnosis not present

## 2020-12-17 DIAGNOSIS — T169XXA Foreign body in ear, unspecified ear, initial encounter: Secondary | ICD-10-CM | POA: Diagnosis not present

## 2020-12-24 DIAGNOSIS — B369 Superficial mycosis, unspecified: Secondary | ICD-10-CM | POA: Diagnosis not present

## 2020-12-24 DIAGNOSIS — H6243 Otitis externa in other diseases classified elsewhere, bilateral: Secondary | ICD-10-CM | POA: Diagnosis not present

## 2020-12-24 DIAGNOSIS — Z974 Presence of external hearing-aid: Secondary | ICD-10-CM | POA: Diagnosis not present

## 2020-12-29 DIAGNOSIS — H11042 Peripheral pterygium, stationary, left eye: Secondary | ICD-10-CM | POA: Diagnosis not present

## 2020-12-29 DIAGNOSIS — H401132 Primary open-angle glaucoma, bilateral, moderate stage: Secondary | ICD-10-CM | POA: Diagnosis not present

## 2021-01-04 DIAGNOSIS — L570 Actinic keratosis: Secondary | ICD-10-CM | POA: Diagnosis not present

## 2021-01-04 DIAGNOSIS — L821 Other seborrheic keratosis: Secondary | ICD-10-CM | POA: Diagnosis not present

## 2021-01-04 DIAGNOSIS — D225 Melanocytic nevi of trunk: Secondary | ICD-10-CM | POA: Diagnosis not present

## 2021-01-04 DIAGNOSIS — L814 Other melanin hyperpigmentation: Secondary | ICD-10-CM | POA: Diagnosis not present

## 2021-01-07 DIAGNOSIS — H938X3 Other specified disorders of ear, bilateral: Secondary | ICD-10-CM | POA: Diagnosis not present

## 2021-01-07 DIAGNOSIS — Z8669 Personal history of other diseases of the nervous system and sense organs: Secondary | ICD-10-CM | POA: Diagnosis not present

## 2021-01-25 DIAGNOSIS — L570 Actinic keratosis: Secondary | ICD-10-CM | POA: Diagnosis not present

## 2021-02-27 DIAGNOSIS — R1032 Left lower quadrant pain: Secondary | ICD-10-CM | POA: Diagnosis not present

## 2021-02-27 DIAGNOSIS — Z8719 Personal history of other diseases of the digestive system: Secondary | ICD-10-CM | POA: Diagnosis not present

## 2021-04-28 DIAGNOSIS — L570 Actinic keratosis: Secondary | ICD-10-CM | POA: Diagnosis not present

## 2021-05-25 DIAGNOSIS — M10071 Idiopathic gout, right ankle and foot: Secondary | ICD-10-CM | POA: Diagnosis not present

## 2021-06-15 DIAGNOSIS — L570 Actinic keratosis: Secondary | ICD-10-CM | POA: Diagnosis not present

## 2021-06-24 DIAGNOSIS — H401132 Primary open-angle glaucoma, bilateral, moderate stage: Secondary | ICD-10-CM | POA: Diagnosis not present

## 2021-06-25 DIAGNOSIS — H401132 Primary open-angle glaucoma, bilateral, moderate stage: Secondary | ICD-10-CM | POA: Diagnosis not present

## 2021-07-06 DIAGNOSIS — H1033 Unspecified acute conjunctivitis, bilateral: Secondary | ICD-10-CM | POA: Diagnosis not present

## 2021-07-13 DIAGNOSIS — E669 Obesity, unspecified: Secondary | ICD-10-CM | POA: Diagnosis not present

## 2021-07-13 DIAGNOSIS — K5900 Constipation, unspecified: Secondary | ICD-10-CM | POA: Diagnosis not present

## 2021-07-13 DIAGNOSIS — M109 Gout, unspecified: Secondary | ICD-10-CM | POA: Diagnosis not present

## 2021-07-13 DIAGNOSIS — J309 Allergic rhinitis, unspecified: Secondary | ICD-10-CM | POA: Diagnosis not present

## 2021-07-13 DIAGNOSIS — G4700 Insomnia, unspecified: Secondary | ICD-10-CM | POA: Diagnosis not present

## 2021-07-13 DIAGNOSIS — Z0000 Encounter for general adult medical examination without abnormal findings: Secondary | ICD-10-CM | POA: Diagnosis not present

## 2021-07-13 DIAGNOSIS — N400 Enlarged prostate without lower urinary tract symptoms: Secondary | ICD-10-CM | POA: Diagnosis not present

## 2021-07-13 DIAGNOSIS — Z125 Encounter for screening for malignant neoplasm of prostate: Secondary | ICD-10-CM | POA: Diagnosis not present

## 2021-07-13 DIAGNOSIS — H6092 Unspecified otitis externa, left ear: Secondary | ICD-10-CM | POA: Diagnosis not present

## 2021-07-13 DIAGNOSIS — Z1331 Encounter for screening for depression: Secondary | ICD-10-CM | POA: Diagnosis not present

## 2021-07-13 DIAGNOSIS — R7303 Prediabetes: Secondary | ICD-10-CM | POA: Diagnosis not present

## 2021-07-13 DIAGNOSIS — R6882 Decreased libido: Secondary | ICD-10-CM | POA: Diagnosis not present

## 2021-07-13 DIAGNOSIS — Z136 Encounter for screening for cardiovascular disorders: Secondary | ICD-10-CM | POA: Diagnosis not present

## 2021-07-15 DIAGNOSIS — R69 Illness, unspecified: Secondary | ICD-10-CM | POA: Diagnosis not present

## 2021-07-15 DIAGNOSIS — R6882 Decreased libido: Secondary | ICD-10-CM | POA: Diagnosis not present

## 2021-07-29 DIAGNOSIS — R6882 Decreased libido: Secondary | ICD-10-CM | POA: Diagnosis not present

## 2021-07-29 DIAGNOSIS — R69 Illness, unspecified: Secondary | ICD-10-CM | POA: Diagnosis not present

## 2021-08-09 DIAGNOSIS — H1045 Other chronic allergic conjunctivitis: Secondary | ICD-10-CM | POA: Diagnosis not present

## 2021-08-09 DIAGNOSIS — H401132 Primary open-angle glaucoma, bilateral, moderate stage: Secondary | ICD-10-CM | POA: Diagnosis not present

## 2021-08-20 DIAGNOSIS — Z974 Presence of external hearing-aid: Secondary | ICD-10-CM | POA: Diagnosis not present

## 2021-08-20 DIAGNOSIS — H6240 Otitis externa in other diseases classified elsewhere, unspecified ear: Secondary | ICD-10-CM | POA: Diagnosis not present

## 2021-08-24 DIAGNOSIS — H10433 Chronic follicular conjunctivitis, bilateral: Secondary | ICD-10-CM | POA: Diagnosis not present

## 2021-09-06 DIAGNOSIS — M65331 Trigger finger, right middle finger: Secondary | ICD-10-CM | POA: Diagnosis not present

## 2021-09-06 DIAGNOSIS — M65341 Trigger finger, right ring finger: Secondary | ICD-10-CM | POA: Diagnosis not present

## 2021-09-21 ENCOUNTER — Emergency Department (HOSPITAL_COMMUNITY): Payer: Medicare HMO

## 2021-09-21 ENCOUNTER — Emergency Department (HOSPITAL_COMMUNITY)
Admission: EM | Admit: 2021-09-21 | Discharge: 2021-09-21 | Disposition: A | Discharge status: Home / Self Care | Payer: Medicare HMO | Attending: Emergency Medicine | Admitting: Emergency Medicine

## 2021-09-21 ENCOUNTER — Encounter (HOSPITAL_COMMUNITY): Payer: Self-pay

## 2021-09-21 DIAGNOSIS — R079 Chest pain, unspecified: Secondary | ICD-10-CM

## 2021-09-21 DIAGNOSIS — R0789 Other chest pain: Secondary | ICD-10-CM | POA: Insufficient documentation

## 2021-09-21 LAB — CBC
HCT: 49.2 % (ref 39.0–52.0)
Hemoglobin: 16.3 g/dL (ref 13.0–17.0)
MCH: 29.9 pg (ref 26.0–34.0)
MCHC: 33.1 g/dL (ref 30.0–36.0)
MCV: 90.3 fL (ref 80.0–100.0)
Platelets: 168 10*3/uL (ref 150–400)
RBC: 5.45 MIL/uL (ref 4.22–5.81)
RDW: 13.2 % (ref 11.5–15.5)
WBC: 5.3 10*3/uL (ref 4.0–10.5)
nRBC: 0 % (ref 0.0–0.2)

## 2021-09-21 LAB — BASIC METABOLIC PANEL
Anion gap: 9 (ref 5–15)
BUN: 17 mg/dL (ref 8–23)
CO2: 25 mmol/L (ref 22–32)
Calcium: 9.7 mg/dL (ref 8.9–10.3)
Chloride: 106 mmol/L (ref 98–111)
Creatinine, Ser: 1.05 mg/dL (ref 0.61–1.24)
GFR, Estimated: >60 mL/min (ref >60)
Glucose, Bld: 92 mg/dL (ref 70–99)
Potassium: 4.5 mmol/L (ref 3.5–5.1)
Sodium: 140 mmol/L (ref 135–145)

## 2021-09-21 LAB — TROPONIN I (HIGH SENSITIVITY)
Troponin I (High Sensitivity): 3 ng/L (ref <18)
Troponin I (High Sensitivity): 4 ng/L (ref <18)

## 2021-09-21 MED ORDER — NITROGLYCERIN 0.4 MG SL SUBL
0.4000 mg | SUBLINGUAL_TABLET | SUBLINGUAL | Status: DC | PRN
  Administered 2021-09-21: 0.4 mg via SUBLINGUAL
  Filled 2021-09-21: qty 1

## 2021-09-21 MED ORDER — ASPIRIN 81 MG PO CHEW
324.0000 mg | CHEWABLE_TABLET | Freq: Once | ORAL | Status: AC
  Administered 2021-09-21: 324 mg via ORAL
  Filled 2021-09-21: qty 4

## 2021-09-21 NOTE — ED Triage Notes (Signed)
Pt presents with c/o chest pain that started today after he woke up from an afternoon nap. Pt reports the pain is in the left of his chest and radiates down his left arm.

## 2021-09-21 NOTE — ED Provider Notes (Signed)
[facility] DEPT Provider Note   CSN: [id number] Arrival date & time: 09/21/21  1641     History {Add pertinent medical, surgical, social history, OB history to HPI:1} Chief Complaint  Patient presents with   Chest Pain    Maurice Garcia is a 71 y.o. male.  HPI      71yo male with history of sensorineural hearing loss, pre-DM, who presents with concern for chest pain.    Woke up from a nap around 3PM with chest pain with radiation to the left arm Felt like a crick in the shoulder, reminds him of a disc problem he had in his neck, same type of discomfort but not as severe Tightness, pressure, discomfort Not worse with deep breaths, exertion, spositoins Was pulling a little bit holding arm and gown above head seemed to help some Has been doing a lot of exercise and work , Marine scientist, has been Museum/gallery curator.  Was using a cane because hip bothering him earlier today, prior to that he was having some hip pain When turn head to the right feels like is pulling back of left shoulder  No shortness of breath, nausea, diaphoresis, lightheaded No leg pain or swelling, cough or fever No recent surgery, long trips, hx of dvt/pe No numbness or weakness  No smoking, etoh or other drugs Dad and brother with history of CAD--in 61s not sure if before or after. Father smoking/etoh, had cva when he was in high school, brother had a lot of medical problems, thinks also heart disease, died at younger age, hx of DM, father had carotid artery disease, mom died at younger age but not sure if heart  No known hx of hypertension, no known hyperlipidemia   Past Medical History:  Diagnosis Date   Diverticulosis    Glaucoma    Pre-diabetes    Seasonal allergies      Home Medications Prior to Admission medications   Medication Sig Start Date End Date Taking? Authorizing Provider  5-Hydroxytryptophan (5-HTP) 100 MG CAPS Take 100 mg by mouth at  bedtime.    [provider]  Brimonidine Tartrate (LUMIFY) 0.025 % SOLN Place 1 drop into both eyes in the morning and at bedtime.    [provider]  Calcium-Magnesium-Zinc (CAL-MAG-ZINC PO) Take 1 tablet by mouth daily.    [provider]  colchicine 0.6 MG tablet Take 0.6 mg by mouth daily as needed (gout flare).    [provider]  diphenhydrAMINE-zinc acetate (BENADRYL) cream Apply 1 application topically daily as needed (poison ivy).    [provider]  famotidine-calcium carbonate-magnesium hydroxide (PEPCID COMPLETE) 10-800-165 MG chewable tablet Chew 1 tablet by mouth daily as needed (acid reflux).    [provider]  Garlic 7425 MG CAPS Take 2,000 mg by mouth daily.    [provider]  HYDROcodone-acetaminophen (NORCO) 5-325 MG tablet Take 1 tablet by mouth every 4 (four) hours as needed for moderate pain. 06/12/19   Swinteck, Aaron Edelman, MD  latanoprost (XALATAN) 0.005 % ophthalmic solution Place 1 drop into both eyes at bedtime.    [provider]  Melatonin 5 MG CAPS Take 5 mg by mouth at bedtime.    [provider]  Multiple Vitamins-Minerals (CENTRUM SILVER PO) Take 1 tablet by mouth daily.    [provider]  naproxen sodium (ALEVE) 220 MG tablet Take 440 mg by mouth in the morning and at bedtime.    [provider]  ondansetron (  ZOFRAN) 4 MG tablet Take 1 tablet (4 mg total) by mouth every 8 (eight) hours as needed for nausea or vomiting. 06/12/19   Swinteck, Aaron Edelman, MD  OVER THE COUNTER MEDICATION Take 3 tablets by mouth daily. Heal and soothe otc supplement    [provider]  Propylene Glycol-Glycerin (SOOTHE OP) Place 1 drop into both eyes daily as needed (dry eyes).    [provider]  Pseudoeph-Doxylamine-DM-APAP (NYQUIL PO) Take 30 mLs by mouth at bedtime as needed (allergies).    [provider]  pseudoephedrine (SUDAFED) 30 MG tablet Take 60 mg by mouth daily  as needed for congestion.    [provider]  Pseudoephedrine-APAP-DM (DAYQUIL PO) Take 30 mLs by mouth 2 (two) times daily as needed (allergies).    [provider]  PSYLLIUM HUSK PO Take 2 capsules by mouth daily.    [provider]  rOPINIRole (REQUIP) 0.5 MG tablet Take 0.5 mg by mouth at bedtime as needed (restless legs).    [provider]  tamsulosin (FLOMAX) 0.4 MG CAPS capsule Take 0.4 mg by mouth every evening.    [provider]  zolpidem (AMBIEN) 10 MG tablet Take 5 mg by mouth at bedtime as needed for sleep.    [provider]      Allergies    Patient has no known allergies.    Review of Systems   Review of Systems  Physical Exam Updated Vital Signs BP (!) 158/107   Pulse (!) 55   Temp 97.8 F (36.6 C) (Oral)   Resp 16   Ht 6' (1.829 m)   Wt 107.5 kg   SpO2 98%   BMI 32.14 kg/m  Physical Exam  ED Results / Procedures / Treatments   Labs (all labs ordered are listed, but only abnormal results are displayed) Labs Reviewed  BASIC METABOLIC PANEL  CBC  TROPONIN I (HIGH SENSITIVITY)  TROPONIN I (HIGH SENSITIVITY)    EKG EKG Interpretation  Date/Time:  Tuesday September 21 2021 16:50:25 EDT Ventricular Rate:  53 PR Interval:  181 QRS Duration: 83 QT Interval:  390 QTC Calculation: 367 R Axis:   83 Text Interpretation: Sinus rhythm Ventricular premature complex Low voltage with right axis deviation Consider anterior infarct No previous ECGs available Confirmed by Gareth Morgan [phone]) on 09/21/2021 7:36:50 PM  Radiology DG Chest 2 View  Result Date: 09/21/2021 CLINICAL DATA:  chest pain EXAM: CHEST - 2 VIEW COMPARISON:  None Available. FINDINGS: The heart and mediastinal contours are within normal limits. No focal consolidation. No pulmonary edema. No pleural effusion. No pneumothorax. No acute osseous abnormality. IMPRESSION: No active cardiopulmonary disease. Electronically Signed   By: Iven Finn  M.D.   On: 09/21/2021 17:21    Procedures Procedures  {Document cardiac monitor, telemetry assessment procedure when appropriate:1}  Medications Ordered in ED Medications - No data to display  ED Course/ Medical Decision Making/ A&P                           Medical Decision Making Amount and/or Complexity of Data Reviewed Labs: ordered. Radiology: ordered.   ***  {Document critical care time when appropriate:1} {Document review of labs and clinical decision tools ie heart score, Chads2Vasc2 etc:1}  {Document your independent review of radiology images, and any outside records:1} {Document your discussion with family members, caretakers, and with consultants:1} {Document social determinants of health affecting pt's care:1} {Document your decision making why or why not  admission, treatments were needed:1} Final Clinical Impression(s) / ED Diagnoses Final diagnoses:  None    Rx / DC Orders ED Discharge Orders     None

## 2021-09-21 NOTE — ED Provider Triage Note (Signed)
Emergency Medicine Provider Triage Evaluation Note  Maurice Garcia , a 71 y.o. male  was evaluated in triage.  Pt complains of left upper chest pain.  Began after waking up from nap.  He was into left arm.  No shortness of breath, nausea, vomiting, diaphoresis.  He has never had anything like this previously.  Typically able to perform his ADLs without any difficulty.  No lower extremity edema.  Unsure if he slept wrong during his nap.  Review of Systems  Positive: CP Negative:   Physical Exam  BP (!) 158/107   Pulse (!) 55   Temp 97.8 F (36.6 C) (Oral)   Resp 16   Ht 6' (1.829 m)   Wt 107.5 kg   SpO2 98%   BMI 32.14 kg/m  Gen:   Awake, no distress   Resp:  Normal effort  MSK:   Moves extremities without difficulty  Other:    Medical Decision Making  Medically screening exam initiated at 5:13 PM.  Appropriate orders placed.  Zephaniah Tish Frederickson was informed that the remainder of the evaluation will be completed by another provider, this initial triage assessment does not replace that evaluation, and the importance of remaining in the ED until their evaluation is complete.  CP   Ishi Danser A, PA-C 09/21/21 1714

## 2021-09-27 DIAGNOSIS — H10433 Chronic follicular conjunctivitis, bilateral: Secondary | ICD-10-CM | POA: Diagnosis not present

## 2021-09-27 DIAGNOSIS — H401132 Primary open-angle glaucoma, bilateral, moderate stage: Secondary | ICD-10-CM | POA: Diagnosis not present

## 2021-09-28 DIAGNOSIS — R030 Elevated blood-pressure reading, without diagnosis of hypertension: Secondary | ICD-10-CM | POA: Diagnosis not present

## 2021-09-28 DIAGNOSIS — R079 Chest pain, unspecified: Secondary | ICD-10-CM | POA: Diagnosis not present

## 2021-09-28 DIAGNOSIS — M5412 Radiculopathy, cervical region: Secondary | ICD-10-CM | POA: Diagnosis not present

## 2021-10-01 DIAGNOSIS — H903 Sensorineural hearing loss, bilateral: Secondary | ICD-10-CM | POA: Diagnosis not present

## 2021-10-01 DIAGNOSIS — Z8669 Personal history of other diseases of the nervous system and sense organs: Secondary | ICD-10-CM | POA: Diagnosis not present

## 2021-10-04 DIAGNOSIS — M65341 Trigger finger, right ring finger: Secondary | ICD-10-CM | POA: Diagnosis not present

## 2021-10-04 DIAGNOSIS — M65331 Trigger finger, right middle finger: Secondary | ICD-10-CM | POA: Diagnosis not present

## 2021-10-05 ENCOUNTER — Other Ambulatory Visit: Payer: Self-pay | Admitting: Physician Assistant

## 2021-10-05 DIAGNOSIS — R200 Anesthesia of skin: Secondary | ICD-10-CM

## 2021-10-05 DIAGNOSIS — M542 Cervicalgia: Secondary | ICD-10-CM

## 2021-10-14 ENCOUNTER — Ambulatory Visit: Payer: Medicare HMO | Admitting: Cardiology

## 2021-10-14 ENCOUNTER — Other Ambulatory Visit: Payer: Medicare HMO

## 2021-10-14 ENCOUNTER — Encounter: Payer: Self-pay | Admitting: Cardiology

## 2021-10-14 VITALS — BP 120/70 | HR 70 | Ht 72.0 in | Wt 241.0 lb

## 2021-10-14 DIAGNOSIS — R011 Cardiac murmur, unspecified: Secondary | ICD-10-CM

## 2021-10-14 DIAGNOSIS — E782 Mixed hyperlipidemia: Secondary | ICD-10-CM

## 2021-10-14 DIAGNOSIS — R072 Precordial pain: Secondary | ICD-10-CM | POA: Diagnosis not present

## 2021-10-14 MED ORDER — METOPROLOL TARTRATE 50 MG PO TABS: 50.0000 mg | ORAL_TABLET | ORAL | 0 refills | Status: DC

## 2021-10-14 MED ORDER — METOPROLOL TARTRATE 50 MG PO TABS: 50.0000 mg | ORAL_TABLET | ORAL | 3 refills | Status: DC

## 2021-10-14 NOTE — Progress Notes (Signed)
Cardiology Office Note:    Date:  10/14/2021   ID:  JABE JEANBAPTISTE, DOB [birth year], MRN [id number]  PCP:  Maury Dus, MD   [facility] Providers Cardiologist:  None     Referring MD: Gareth Morgan, MD    History of Present Illness:    Maurice Garcia is a 71 y.o. male here for the evaluation of chest pain at the request of Dr. Alyson Ingles.  Was recently seen in the emergency department on 09/21/2021 with chest discomfort.  He woke up from a nap at around 3 PM and had chest discomfort centralized with radiation to his left arm.  He had tightness pressure discomfort did not seem to be worse with deep breathing.  No smoking.  His father and brother had prior history of coronary disease in their 69s.  His father had a stroke while he was in high school.  Brother died at a younger age.  Troponin was normal in the emergency department  EKG shows sinus rhythm 53 with PVC  Chest x-ray was unremarkable   Past Medical History:  Diagnosis Date   Chest pain    Diverticulosis    Glaucoma    Pre-diabetes    Seasonal allergies     Past Surgical History:  Procedure Laterality Date   cervical disc repaired     EYE SURGERY     cataract surgery bilat    TOTAL HIP ARTHROPLASTY Right 06/12/2019   Procedure: TOTAL HIP ARTHROPLASTY ANTERIOR APPROACH;  Surgeon: Rod Can, MD;  Location: [facility];  Service: Orthopedics;  Laterality: Right;    Current Medications: Current Meds  Medication Sig   5-Hydroxytryptophan (5-HTP) 100 MG CAPS Take 100 mg by mouth at bedtime.   acetaminophen (TYLENOL) 325 MG tablet as needed for pain.   amoxicillin (AMOXIL) 500 MG tablet as needed. Dental work   Brimonidine Tartrate (LUMIFY) 0.025 % SOLN Place 1 drop into both eyes daily.   Calcium-Magnesium-Zinc (CAL-MAG-ZINC PO) Take 1 tablet by mouth daily.   cetirizine (ZYRTEC ALLERGY) 10 MG tablet as needed for allergies.   colchicine 0.6 MG tablet Take 0.6 mg by mouth daily as needed (gout  flare).   cyclobenzaprine (FLEXERIL) 10 MG tablet daily at 6 (six) AM. Muscle spasm   diphenhydrAMINE (BENADRYL ALLERGY) 25 MG tablet as needed for allergies.   diphenhydrAMINE-zinc acetate (BENADRYL) cream Apply 1 application topically daily as needed (poison ivy).   famotidine-calcium carbonate-magnesium hydroxide (PEPCID COMPLETE) 10-800-165 MG chewable tablet Chew 1 tablet by mouth daily as needed (acid reflux).   Garlic 2641 MG CAPS Take 2,000 mg by mouth daily.   guaiFENesin (MUCINEX) 600 MG 12 hr tablet as needed. Cold symptoms   Melatonin 5 MG CAPS Take 5 mg by mouth at bedtime.   metoprolol tartrate (LOPRESSOR) 50 MG tablet Take 1 tablet (50 mg total) by mouth as directed. Take one tablet 2 hours before your CT scan.   Multiple Vitamins-Minerals (CENTRUM SILVER PO) Take 1 tablet by mouth daily.   naproxen sodium (ALEVE) 220 MG tablet Take 440 mg by mouth in the morning and at bedtime.   OVER THE COUNTER MEDICATION Take 3 tablets by mouth daily. Heal and soothe otc supplement   Propylene Glycol-Glycerin (SOOTHE OP) Place 1 drop into both eyes daily as needed (dry eyes).   pseudoephedrine (SUDAFED) 30 MG tablet Take 60 mg by mouth daily as needed for congestion.   PSYLLIUM HUSK PO Take 3 capsules by mouth daily.   rOPINIRole (REQUIP) 0.5 MG tablet Take  0.5 mg by mouth at bedtime as needed (restless legs).   sildenafil (VIAGRA) 100 MG tablet as needed for erectile dysfunction.   tamsulosin (FLOMAX) 0.4 MG CAPS capsule Take 0.4 mg by mouth every evening.   testosterone (ANDROGEL) 50 MG/5GM (1%) GEL daily.   timolol (TIMOPTIC) 0.5 % ophthalmic solution daily at 6 (six) AM.   zolpidem (AMBIEN) 10 MG tablet Take 5 mg by mouth at bedtime as needed for sleep.   [DISCONTINUED] metoprolol tartrate (LOPRESSOR) 50 MG tablet Take 1 tablet (50 mg total) by mouth as directed. Take one tablet 2 hours before your CT scan.     Allergies:   Triamcinolone and Triamcinolone acetonide   Social History    Socioeconomic History   Marital status: Married    Spouse name: Not on file   Number of children: Not on file   Years of education: Not on file   Highest education level: Not on file  Occupational History   Not on file  Tobacco Use   Smoking status: Never   Smokeless tobacco: Never  Vaping Use   Vaping Use: Never used  Substance and Sexual Activity   Alcohol use: Never   Drug use: Never   Sexual activity: Not on file  Other Topics Concern   Not on file  Social History Narrative   Not on file   Social Determinants of Health   Financial Resource Strain: Not on file  Food Insecurity: Not on file  Transportation Needs: Not on file  Physical Activity: Not on file  Stress: Not on file  Social Connections: Not on file     Family History: The patient's family history is not on file.  ROS:   Please see the history of present illness.     All other systems reviewed and are negative.  EKGs/Labs/Other Studies Reviewed:    The following studies were reviewed today: ER, chest x-ray, lab work as above  EKG: As described above  Recent Labs: 09/21/2021: BUN 17; Creatinine, Ser 1.05; Hemoglobin 16.3; Platelets 168; Potassium 4.5; Sodium 140  Recent Lipid Panel No results found for: "CHOL", "TRIG", "HDL", "CHOLHDL", "VLDL", "LDLCALC", "LDLDIRECT"   Risk Assessment/Calculations:              Physical Exam:    VS:  BP 120/70   Pulse 70   Ht 6' (1.829 m)   Wt 241 lb (109.3 kg)   SpO2 97%   BMI 32.69 kg/m     Wt Readings from Last 3 Encounters:  10/14/21 241 lb (109.3 kg)  09/21/21 237 lb (107.5 kg)  06/12/19 233 lb 9 oz (105.9 kg)     GEN:  Well nourished, well developed in no acute distress HEENT: Normal NECK: No JVD; No carotid bruits LYMPHATICS: No lymphadenopathy CARDIAC: RRR, 2/6 SM apex, no rubs, gallops RESPIRATORY:  Clear to auscultation without rales, wheezing or rhonchi  ABDOMEN: Soft, non-tender, non-distended MUSCULOSKELETAL:  No edema; No  deformity  SKIN: Warm and dry NEUROLOGIC:  Alert and oriented x 3 PSYCHIATRIC:  Normal affect   ASSESSMENT:    1. Precordial chest pain   2. Mixed hyperlipidemia   3. Heart murmur    PLAN:    In order of problems listed above:  Precordial chest pain - Recent ER visit reviewed.  Thankfully troponin was normal.  EKG did not show any ischemic changes.  He does have a brother and a father who had early coronary artery disease however.  Given the symptoms, we will proceed with  coronary CT scan for further analysis.  Interestingly his brother who is 38 years older than him is going to have a coronary calcium score done soon.  Differential still continues to include his cervical radiculopathy.  Coronary CT would be helpful if surgical needs are required for his neck. -He knows to contact us if symptoms worsen or become more worrisome.  Mixed hyperlipidemia - Triglycerides 255 LDL 95, hemoglobin A1c 5.8 creatinine 1.0 ALT 37.  Continuing with diet, exercise.  If coronary calcification or plaque is noted, we will have low threshold for utilization of Crestor.  Heart murmur - Apical murmur sounds like mitral regurgitation.  We will check an echocardiogram to further quantify.  Medication Adjustments/Labs and Tests Ordered: Current medicines are reviewed at length with the patient today.  Concerns regarding medicines are outlined above.  Orders Placed This Encounter  Procedures   CT CORONARY MORPH W/CTA COR W/SCORE W/CA W/CM &/OR WO/CM   ECHOCARDIOGRAM COMPLETE   Meds ordered this encounter  Medications   DISCONTD: metoprolol tartrate (LOPRESSOR) 50 MG tablet    Sig: Take 1 tablet (50 mg total) by mouth as directed. Take one tablet 2 hours before your CT scan.    Dispense:  180 tablet    Refill:  3   metoprolol tartrate (LOPRESSOR) 50 MG tablet    Sig: Take 1 tablet (50 mg total) by mouth as directed. Take one tablet 2 hours before your CT scan.    Dispense:  1 tablet    Refill:  0     Please d/c any other RX for Metoprolol tartrate    Patient Instructions  Medication Instructions:  The current medical regimen is effective;  continue present plan and medications.  *If you need a refill on your cardiac medications before your next appointment, please call your pharmacy*  Testing/Procedures: Your physician has requested that you have an echocardiogram. Echocardiography is a painless test that uses sound waves to create images of your heart. It provides your doctor with information about the size and shape of your heart and how well your heart's chambers and valves are working. This procedure takes approximately one hour. There are no restrictions for this procedure.    Your cardiac CT will be scheduled at:   [facility] [address] [place], [place] [address] [phone]  Please arrive at the [facility] and Children's Entrance (Entrance C2) of [facility] 30 minutes prior to test start time. You can use the FREE valet parking offered at entrance C (encouraged to control the heart rate for the test)  Proceed to the [facility] Radiology Department (first floor) to check-in and test prep.  All radiology patients and guests should use entrance C2 at [facility], accessed from [facility], even though the hospital's physical address listed is [address].    Please follow these instructions carefully (unless otherwise directed):  Hold all erectile dysfunction medications at least 3 days (72 hrs) prior to test.  On the Night Before the Test: Be sure to Drink plenty of water. Do not consume any caffeinated/decaffeinated beverages or chocolate 12 hours prior to your test. Do not take any antihistamines 12 hours prior to your test.  On the Day of the Test: Drink plenty of water until 1 hour prior to the test. Do not eat any food 4 hours prior to the test. You may take your regular medications prior to the test.   Take metoprolol (Lopressor) two hours prior to  test. HOLD Furosemide/Hydrochlorothiazide morning of the test. FEMALES- please wear underwire-free bra if available, avoid dresses & tight clothing  After the Test: Drink plenty of water. After receiving IV contrast, you may experience a mild flushed feeling. This is normal. On occasion, you may experience a mild rash up to 24 hours after the test. This is not dangerous. If this occurs, you can take Benadryl 25 mg and increase your fluid intake. If you experience trouble breathing, this can be serious. If it is severe call 911 IMMEDIATELY. If it is mild, please call our office. If you take any of these medications: Glipizide/Metformin, Avandament, Glucavance, please do not take 48 hours after completing test unless otherwise instructed.  We will call to schedule your test 2-4 weeks out understanding that some insurance companies will need an authorization prior to the service being performed.   For non-scheduling related questions, please contact the cardiac imaging nurse navigator should you have any questions/concerns: Marchia Bond, Cardiac Imaging Nurse Navigator Gordy Clement, Cardiac Imaging Nurse Navigator [facility] Direct Office Dial: [phone]   For scheduling needs, including cancellations and rescheduling, please call [place], [phone].  Follow-Up: At [facility], you and your health needs are our priority.  As part of our continuing mission to provide you with exceptional heart care, we have created designated Provider Care Teams.  These Care Teams include your primary Cardiologist (physician) and Advanced Practice Providers (APPs -  Physician Assistants and Nurse Practitioners) who all work together to provide you with the care you need, when you need it.  We recommend signing up for the patient portal called "MyChart".  Sign up information is provided on this After Visit Summary.  MyChart is  used to connect with patients for Virtual Visits (Telemedicine).  Patients are able to view lab/test results, encounter notes, upcoming appointments, etc.  Non-urgent messages can be sent to your provider as well.   To learn more about what you can do with MyChart, go to [email or web address].    Your next appointment:   Follow up to be determined after the above testing.   Important Information About Sugar         Signed, Candee Furbish, MD  10/14/2021 4:50 PM    [facility]

## 2021-10-14 NOTE — Patient Instructions (Addendum)
Medication Instructions:  The current medical regimen is effective;  continue present plan and medications.  *If you need a refill on your cardiac medications before your next appointment, please call your pharmacy*  Testing/Procedures: Your physician has requested that you have an echocardiogram. Echocardiography is a painless test that uses sound waves to create images of your heart. It provides your doctor with information about the size and shape of your heart and how well your heart's chambers and valves are working. This procedure takes approximately one hour. There are no restrictions for this procedure.    Your cardiac CT will be scheduled at:   [facility] [address] [place], [place] [address] [phone]  Please arrive at the [facility] and Children's Entrance (Entrance C2) of [facility] 30 minutes prior to test start time. You can use the FREE valet parking offered at entrance C (encouraged to control the heart rate for the test)  Proceed to the [facility] Radiology Department (first floor) to check-in and test prep.  All radiology patients and guests should use entrance C2 at [facility], accessed from [facility], even though the hospital's physical address listed is [address]    Please follow these instructions carefully (unless otherwise directed):  Hold all erectile dysfunction medications at least 3 days (72 hrs) prior to test.  On the Night Before the Test: Be sure to Drink plenty of water. Do not consume any caffeinated/decaffeinated beverages or chocolate 12 hours prior to your test. Do not take any antihistamines 12 hours prior to your test.  On the Day of the Test: Drink plenty of water until 1 hour prior to the test. Do not eat any food 4 hours prior to the test. You may take your regular medications prior to the test.  Take metoprolol (Lopressor) two hours prior to test. HOLD  Furosemide/Hydrochlorothiazide morning of the test. FEMALES- please wear underwire-free bra if available, avoid dresses & tight clothing  After the Test: Drink plenty of water. After receiving IV contrast, you may experience a mild flushed feeling. This is normal. On occasion, you may experience a mild rash up to 24 hours after the test. This is not dangerous. If this occurs, you can take Benadryl 25 mg and increase your fluid intake. If you experience trouble breathing, this can be serious. If it is severe call 911 IMMEDIATELY. If it is mild, please call our office. If you take any of these medications: Glipizide/Metformin, Avandament, Glucavance, please do not take 48 hours after completing test unless otherwise instructed.  We will call to schedule your test 2-4 weeks out understanding that some insurance companies will need an authorization prior to the service being performed.   For non-scheduling related questions, please contact the cardiac imaging nurse navigator should you have any questions/concerns: Marchia Bond, Cardiac Imaging Nurse Navigator Gordy Clement, Cardiac Imaging Nurse Navigator [place] Direct Office Dial: [phone]   For scheduling needs, including cancellations and rescheduling, please call [place], [phone].  Follow-Up: At [facility], you and your health needs are our priority.  As part of our continuing mission to provide you with exceptional heart care, we have created designated Provider Care Teams.  These Care Teams include your primary Cardiologist (physician) and Advanced Practice Providers (APPs -  Physician Assistants and Nurse Practitioners) who all work together to provide you with the care you need, when you need it.  We recommend signing up for the patient portal  called "MyChart".  Sign up information is provided on this After Visit Summary.  MyChart is used to connect with patients for Virtual Visits  (Telemedicine).  Patients are able to view lab/test results, encounter notes, upcoming appointments, etc.  Non-urgent messages can be sent to your provider as well.   To learn more about what you can do with MyChart, go to [email or web address].    Your next appointment:   Follow up to be determined after the above testing.   Important Information About Sugar

## 2021-11-02 ENCOUNTER — Other Ambulatory Visit: Payer: Medicare HMO

## 2021-11-02 ENCOUNTER — Encounter: Payer: Self-pay | Admitting: Cardiology

## 2021-11-03 ENCOUNTER — Telehealth (HOSPITAL_COMMUNITY): Payer: Self-pay | Admitting: *Deleted

## 2021-11-03 NOTE — Telephone Encounter (Signed)
Patient returning call regarding upcoming cardiac imaging study; pt verbalizes understanding of appt date/time, parking situation and where to check in, pre-test NPO status and medications ordered, and verified current allergies; name and call back number provided for further questions should they arise  Lilliane Sposito RN Navigator Cardiac Imaging [facility] [phone] office [phone] cell  Patient to take 50mg metoprolol tartrate two hours prior to his cardiac CT scan. He is aware to arrive 9:30am. 

## 2021-11-03 NOTE — Telephone Encounter (Signed)
Attempted to call patient regarding upcoming cardiac CT appointment. °Left message on voicemail with name and callback number ° °Amerah Puleo RN Navigator Cardiac Imaging °[facility] °[phone] Office °[phone] Cell ° °

## 2021-11-04 ENCOUNTER — Ambulatory Visit (HOSPITAL_COMMUNITY)
Admission: RE | Admit: 2021-11-04 | Discharge: 2021-11-04 | Disposition: A | Discharge status: Home / Self Care | Payer: Medicare HMO | Source: Ambulatory Visit | Attending: Cardiology | Admitting: Cardiology

## 2021-11-04 ENCOUNTER — Ambulatory Visit (HOSPITAL_BASED_OUTPATIENT_CLINIC_OR_DEPARTMENT_OTHER): Payer: Medicare HMO

## 2021-11-04 DIAGNOSIS — R072 Precordial pain: Secondary | ICD-10-CM | POA: Diagnosis not present

## 2021-11-04 DIAGNOSIS — R931 Abnormal findings on diagnostic imaging of heart and coronary circulation: Secondary | ICD-10-CM | POA: Insufficient documentation

## 2021-11-04 DIAGNOSIS — R011 Cardiac murmur, unspecified: Secondary | ICD-10-CM | POA: Insufficient documentation

## 2021-11-04 DIAGNOSIS — I2510 Atherosclerotic heart disease of native coronary artery without angina pectoris: Secondary | ICD-10-CM | POA: Diagnosis not present

## 2021-11-04 LAB — ECHOCARDIOGRAM COMPLETE
AR max vel: 2.55 cm2
AV Area VTI: 2.59 cm2
AV Area mean vel: 2.54 cm2
AV Mean grad: 7.5 mmHg
AV Peak grad: 13.7 mmHg
Ao pk vel: 1.85 m/s
Area-P 1/2: 2.39 cm2
S' Lateral: 3.5 cm

## 2021-11-04 MED ORDER — IOHEXOL 350 MG/ML SOLN
100.0000 mL | Freq: Once | INTRAVENOUS | Status: AC | PRN
  Administered 2021-11-04: 100 mL via INTRAVENOUS

## 2021-11-04 MED ORDER — NITROGLYCERIN 0.4 MG SL SUBL
SUBLINGUAL_TABLET | SUBLINGUAL | Status: AC
  Administered 2021-11-04: 0.8 mg via SUBLINGUAL
  Filled 2021-11-04: qty 2

## 2021-11-04 MED ORDER — NITROGLYCERIN 0.4 MG SL SUBL: 0.8000 mg | SUBLINGUAL_TABLET | Freq: Once | SUBLINGUAL | Status: AC

## 2021-11-05 ENCOUNTER — Ambulatory Visit (HOSPITAL_BASED_OUTPATIENT_CLINIC_OR_DEPARTMENT_OTHER)
Admission: RE | Admit: 2021-11-05 | Discharge: 2021-11-05 | Disposition: A | Discharge status: Home / Self Care | Payer: Medicare HMO | Source: Ambulatory Visit | Attending: Cardiology | Admitting: Cardiology

## 2021-11-05 ENCOUNTER — Other Ambulatory Visit (HOSPITAL_COMMUNITY): Payer: Self-pay | Admitting: Emergency Medicine

## 2021-11-05 ENCOUNTER — Telehealth: Payer: Self-pay | Admitting: *Deleted

## 2021-11-05 ENCOUNTER — Ambulatory Visit (HOSPITAL_COMMUNITY)
Admission: RE | Admit: 2021-11-05 | Discharge: 2021-11-05 | Disposition: A | Discharge status: Home / Self Care | Payer: Medicare HMO | Source: Ambulatory Visit | Attending: Cardiology | Admitting: Cardiology

## 2021-11-05 DIAGNOSIS — R072 Precordial pain: Secondary | ICD-10-CM | POA: Diagnosis not present

## 2021-11-05 DIAGNOSIS — R931 Abnormal findings on diagnostic imaging of heart and coronary circulation: Secondary | ICD-10-CM | POA: Diagnosis not present

## 2021-11-05 DIAGNOSIS — E782 Mixed hyperlipidemia: Secondary | ICD-10-CM

## 2021-11-05 DIAGNOSIS — Z8249 Family history of ischemic heart disease and other diseases of the circulatory system: Secondary | ICD-10-CM

## 2021-11-05 DIAGNOSIS — R011 Cardiac murmur, unspecified: Secondary | ICD-10-CM | POA: Diagnosis not present

## 2021-11-05 DIAGNOSIS — Z79899 Other long term (current) drug therapy: Secondary | ICD-10-CM

## 2021-11-05 MED ORDER — ASPIRIN 81 MG PO TBEC: 81.0000 mg | DELAYED_RELEASE_TABLET | Freq: Every day | ORAL | 3 refills | Status: AC

## 2021-11-05 MED ORDER — ROSUVASTATIN CALCIUM 10 MG PO TABS: 10.0000 mg | ORAL_TABLET | Freq: Every day | ORAL | 3 refills | Status: DC

## 2021-11-05 NOTE — Telephone Encounter (Signed)
Moderate nonobstructive coronary plaque.  Recommend starting Crestor 10 mg once a day.  Check lipid panel in 2 months with ALT.  This will help with plaque stabilization.  Would also start aspirin 81 mg daily.  Watch for any signs of bleeding.  Candee Furbish, MD   Pt aware of results and orders.  Will start ASA 81 mg daily, crestor 10 (sent into pharmacy of choice) and repeat lipid/ALT as ordered and scheduled for 01/06/2022.  Pt does want Dr Marlou Porch to be aware his brother was recently diagnosed with 85% carotid artery stenosis.  Pt would like to know if he needs any testing for this as well.  Advised I will review with Dr Marlou Porch and call back with any new orders.

## 2021-11-06 ENCOUNTER — Encounter: Payer: Self-pay | Admitting: Cardiology

## 2021-11-10 NOTE — Telephone Encounter (Signed)
Let's check carotid Dopplers  Dx. CAD, family history of carotid disease  Maurice Furbish, MD

## 2021-11-11 NOTE — Telephone Encounter (Signed)
Left message for pt that the carotid doppler testing is being ordered for him and he will be contacted to be scheduled.  Requested he call back if any questions/concerns.

## 2021-11-12 NOTE — Telephone Encounter (Signed)
Pt has been scheduled for carotid doppler 9/25.

## 2021-11-22 ENCOUNTER — Ambulatory Visit (HOSPITAL_COMMUNITY)
Admission: RE | Admit: 2021-11-22 | Discharge: 2021-11-22 | Disposition: A | Discharge status: Home / Self Care | Payer: Medicare HMO | Source: Ambulatory Visit | Attending: Internal Medicine | Admitting: Internal Medicine

## 2021-11-22 DIAGNOSIS — Z8249 Family history of ischemic heart disease and other diseases of the circulatory system: Secondary | ICD-10-CM | POA: Insufficient documentation

## 2021-11-22 DIAGNOSIS — E782 Mixed hyperlipidemia: Secondary | ICD-10-CM | POA: Insufficient documentation

## 2021-11-22 DIAGNOSIS — R931 Abnormal findings on diagnostic imaging of heart and coronary circulation: Secondary | ICD-10-CM | POA: Diagnosis not present

## 2021-12-13 DIAGNOSIS — H401132 Primary open-angle glaucoma, bilateral, moderate stage: Secondary | ICD-10-CM | POA: Diagnosis not present

## 2022-01-03 DIAGNOSIS — L578 Other skin changes due to chronic exposure to nonionizing radiation: Secondary | ICD-10-CM | POA: Diagnosis not present

## 2022-01-03 DIAGNOSIS — L570 Actinic keratosis: Secondary | ICD-10-CM | POA: Diagnosis not present

## 2022-01-03 DIAGNOSIS — L814 Other melanin hyperpigmentation: Secondary | ICD-10-CM | POA: Diagnosis not present

## 2022-01-03 DIAGNOSIS — D225 Melanocytic nevi of trunk: Secondary | ICD-10-CM | POA: Diagnosis not present

## 2022-01-03 DIAGNOSIS — L821 Other seborrheic keratosis: Secondary | ICD-10-CM | POA: Diagnosis not present

## 2022-01-06 ENCOUNTER — Ambulatory Visit: Payer: Medicare HMO | Attending: Cardiology

## 2022-01-06 DIAGNOSIS — Z79899 Other long term (current) drug therapy: Secondary | ICD-10-CM

## 2022-01-06 DIAGNOSIS — E782 Mixed hyperlipidemia: Secondary | ICD-10-CM

## 2022-01-06 LAB — ALT: ALT: 46 IU/L — ABNORMAL HIGH (ref 0–44)

## 2022-01-06 LAB — LIPID PANEL
Chol/HDL Ratio: 3 ratio (ref 0.0–5.0)
Cholesterol, Total: 117 mg/dL (ref 100–199)
HDL: 39 mg/dL — ABNORMAL LOW (ref >39)
LDL Chol Calc (NIH): 56 mg/dL (ref 0–99)
Triglycerides: 119 mg/dL (ref 0–149)
VLDL Cholesterol Cal: 22 mg/dL (ref 5–40)

## 2022-01-08 ENCOUNTER — Encounter (HOSPITAL_BASED_OUTPATIENT_CLINIC_OR_DEPARTMENT_OTHER): Payer: Self-pay | Admitting: Cardiology

## 2022-01-12 IMAGING — DX DG PORTABLE PELVIS
1 series · 2 of 2 positions shown · non-contrast
Comparison: None.

CLINICAL DATA: Right hip arthroplasty

EXAM:
PORTABLE PELVIS 1-2 VIEWS

[Series 1: pelvis ap · 0.14mm/px · 2 of 2 slices shown]
[im 1/2]
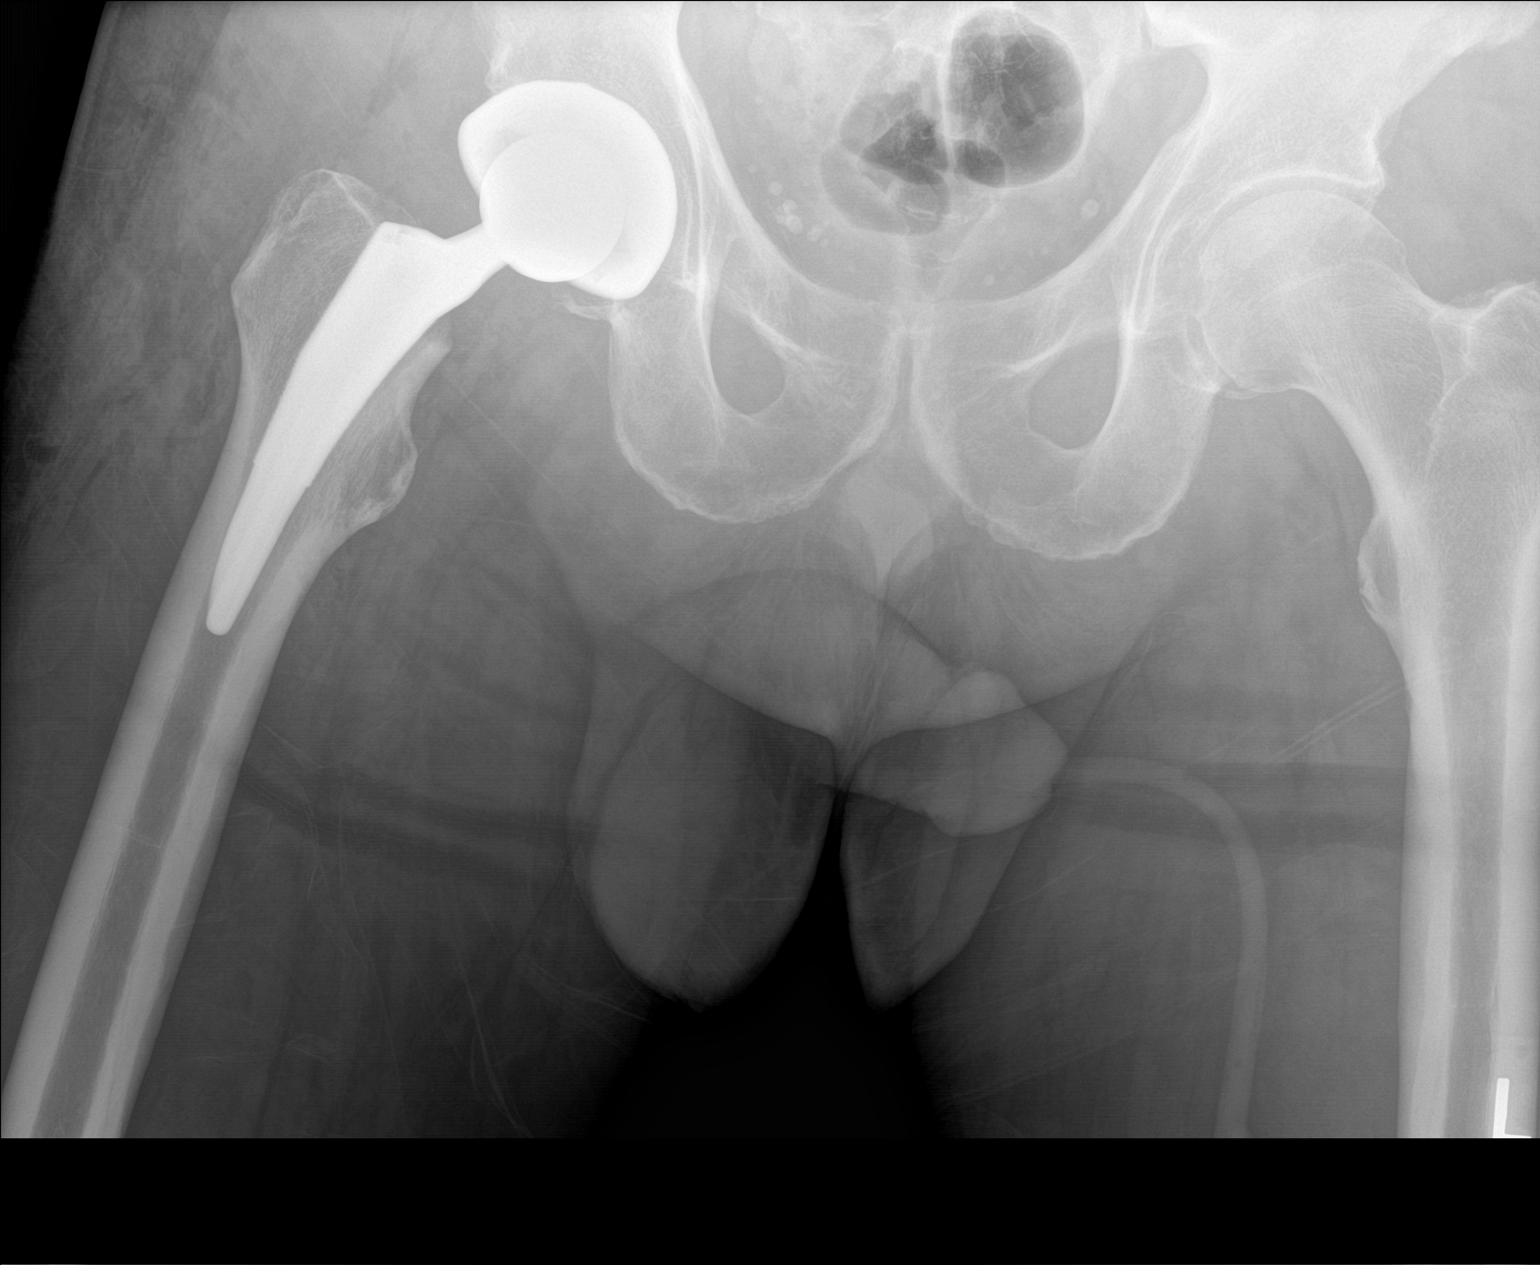
[im 2/2]
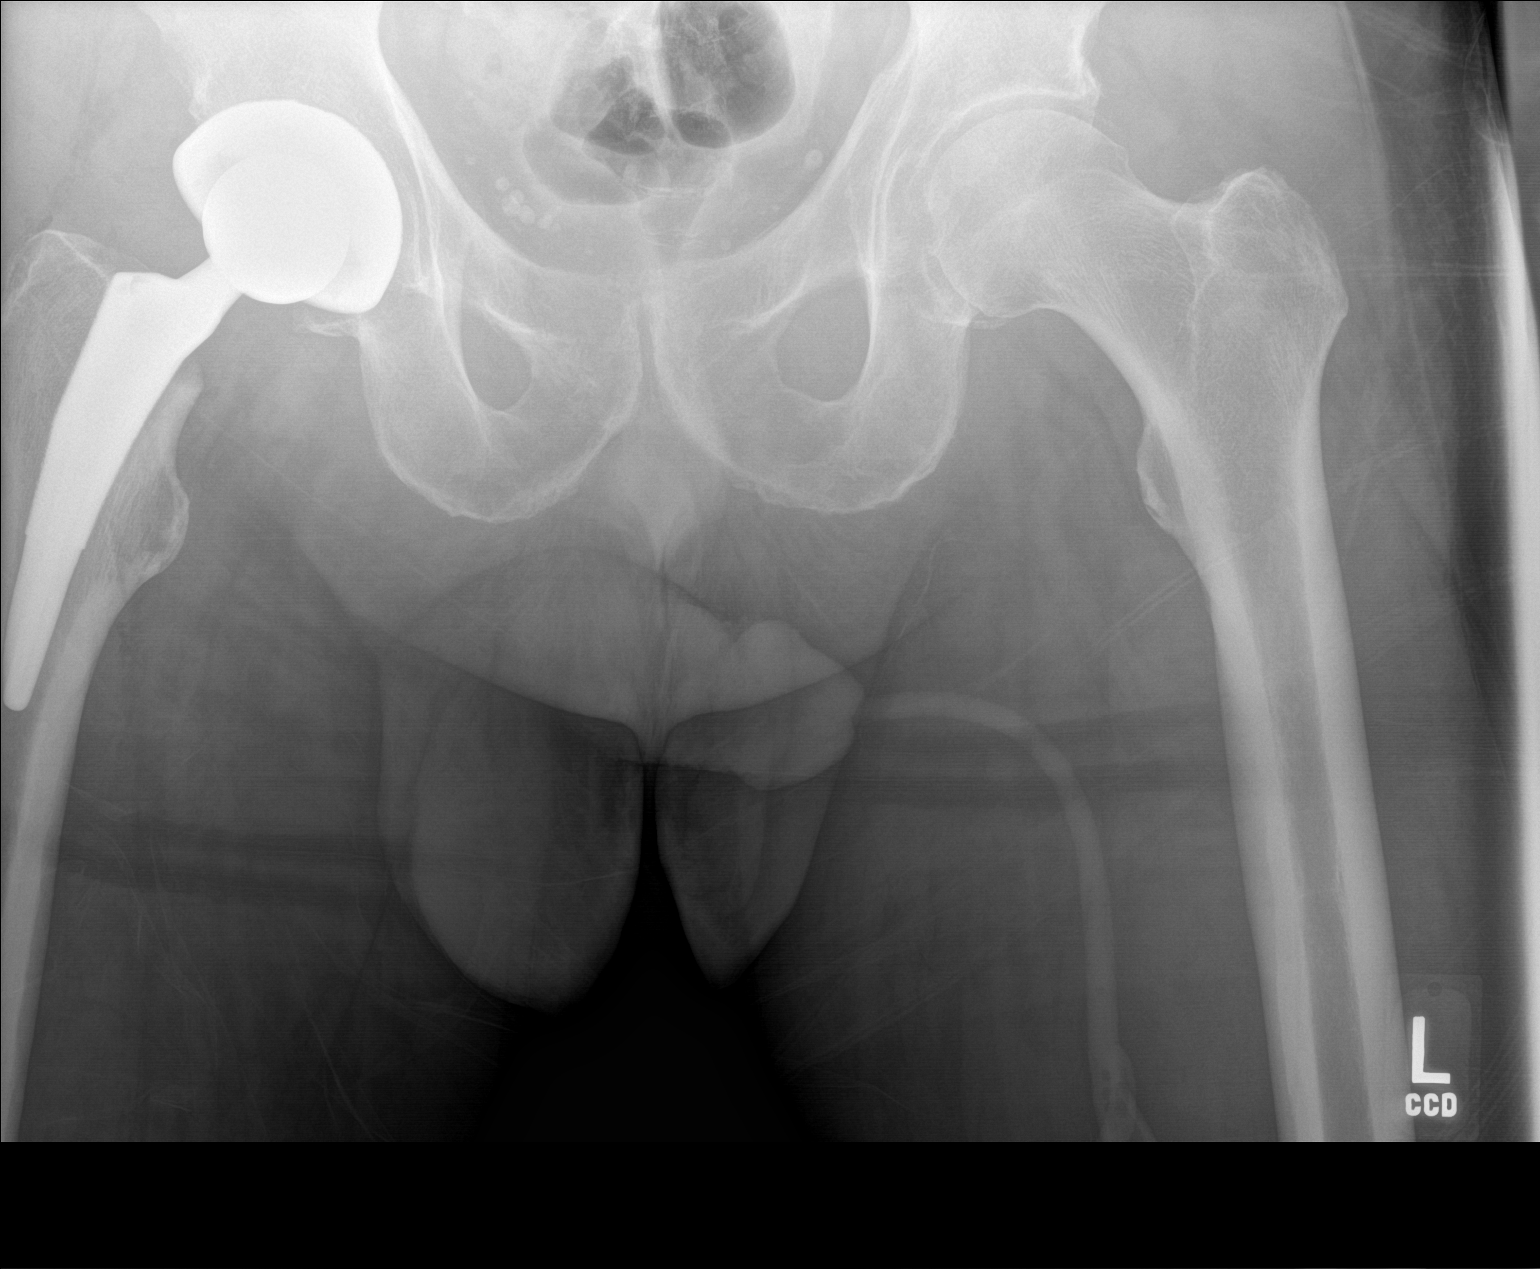

[2 of 2 positions shown; findings below may reference images not displayed]

FINDINGS: Postsurgical changes of right total hip arthroplasty. Arthroplasty
components appear in their expected alignment. No periprosthetic
fracture is seen. Osseous structures appear intact. Expected
postoperative changes to the overlying soft tissues.
IMPRESSION: Satisfactory postoperative appearance following right total hip
arthroplasty.

## 2022-01-12 IMAGING — RF DG HIP (WITH PELVIS) OPERATIVE*R*
1 series · 4 of 4 positions shown · non-contrast
Comparison: None.

CLINICAL DATA: Status post total hip replacement on the right

EXAM:
OPERATIVE RIGHT HIP (WITH PELVIS IF PERFORMED) 1 VIEW
TECHNIQUE: Fluoroscopic spot image(s) were submitted for interpretation
post-operatively.
FLUOROSCOPY TIME:  0 minutes 32 seconds; 4 acquired images

[Series 1: unknown protocol · 0.20mm/px · 4 of 4 slices shown]
[im 1/4]
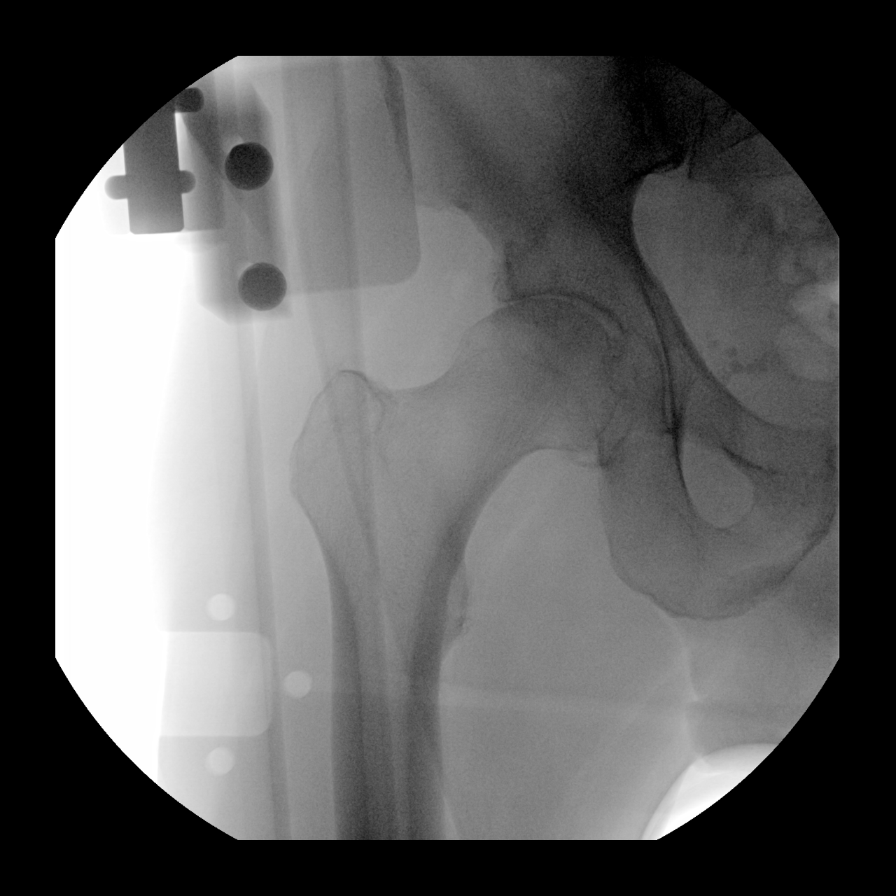
[im 2/4]
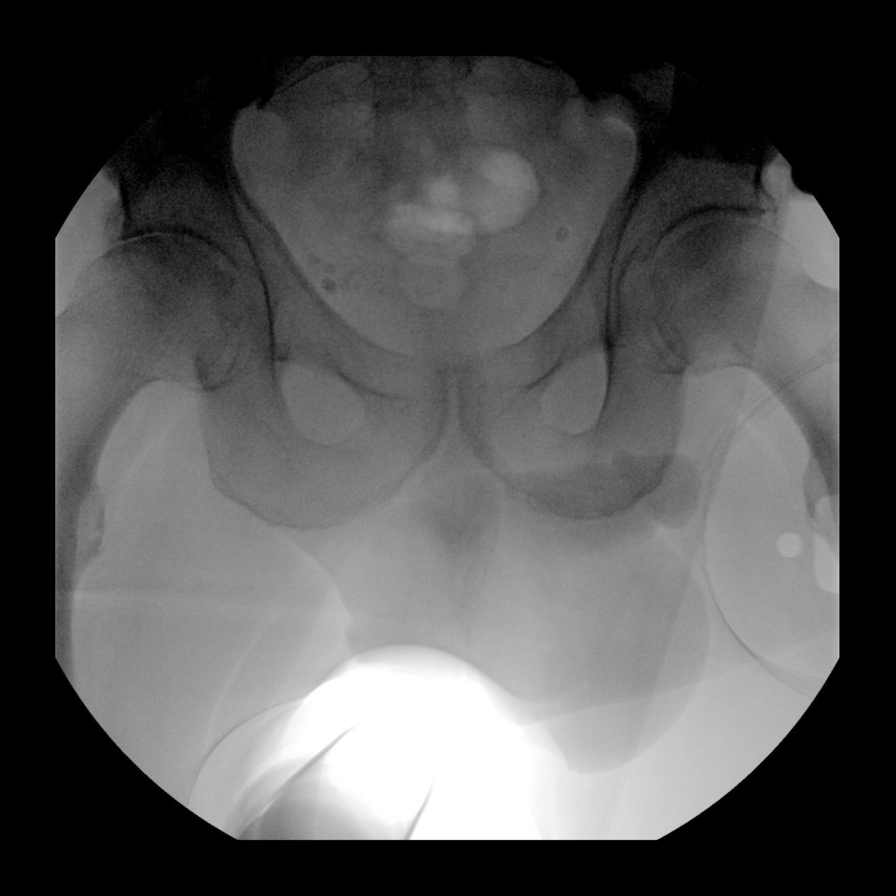
[im 3/4]
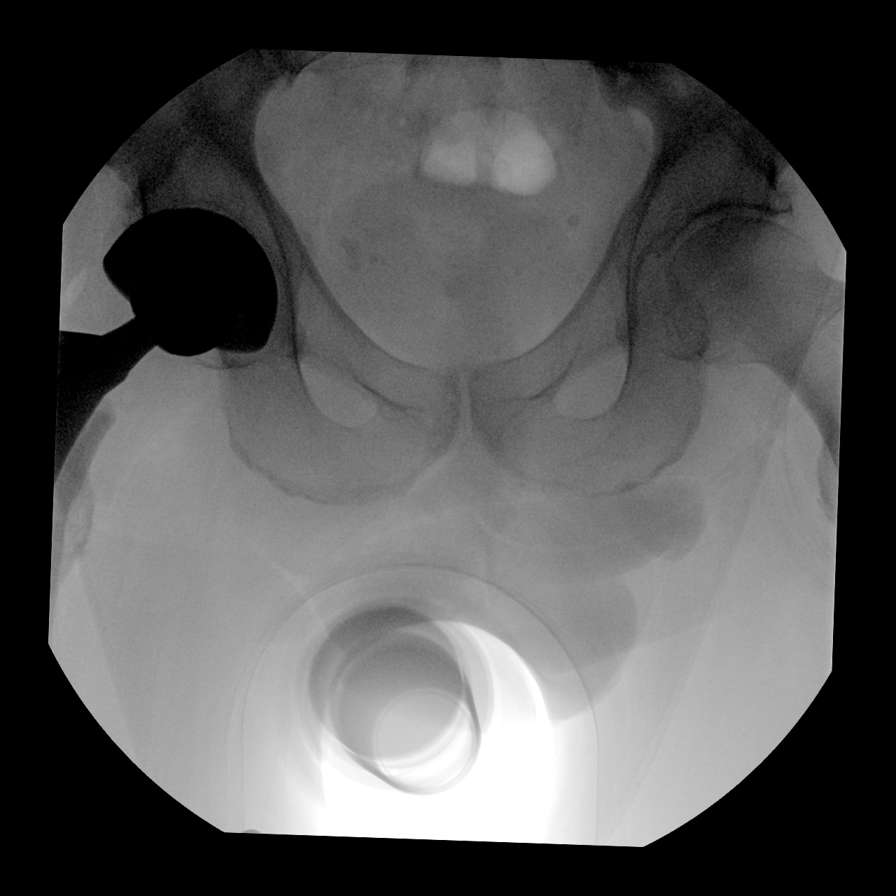
[im 4/4]
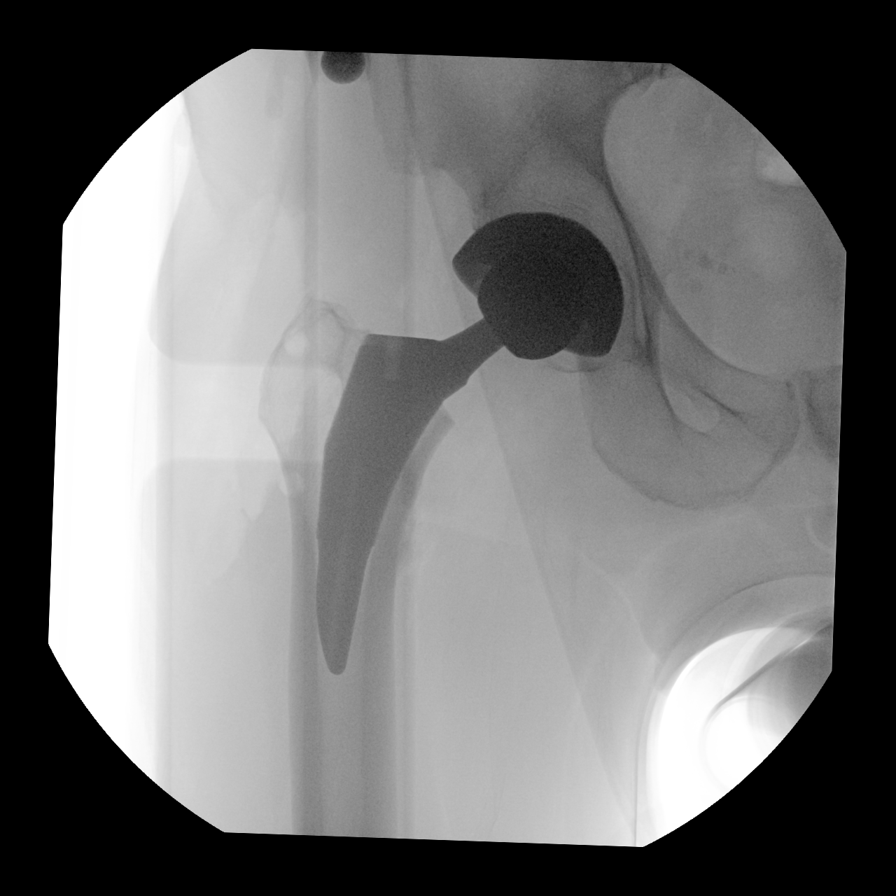

[4 of 4 positions shown; findings below may reference images not displayed]

FINDINGS: Frontal view of each hip joint obtained prior to and after right
total hip replacement. There is a total hip replacement on the right
with prosthetic components well-seated on frontal view. No fracture
or dislocation evident. Slight narrowing left hip joint noted.
IMPRESSION: Total hip replacement on the right performed with prosthetic
components well-seated on frontal view. No fracture or dislocation.
Slight narrowing left hip joint.

## 2022-01-13 DIAGNOSIS — H5789 Other specified disorders of eye and adnexa: Secondary | ICD-10-CM | POA: Diagnosis not present

## 2022-01-13 DIAGNOSIS — E291 Testicular hypofunction: Secondary | ICD-10-CM | POA: Diagnosis not present

## 2022-01-13 DIAGNOSIS — R7303 Prediabetes: Secondary | ICD-10-CM | POA: Diagnosis not present

## 2022-01-13 DIAGNOSIS — H409 Unspecified glaucoma: Secondary | ICD-10-CM | POA: Diagnosis not present

## 2022-02-03 DIAGNOSIS — J019 Acute sinusitis, unspecified: Secondary | ICD-10-CM | POA: Diagnosis not present

## 2022-02-03 DIAGNOSIS — H6692 Otitis media, unspecified, left ear: Secondary | ICD-10-CM | POA: Diagnosis not present

## 2022-02-14 DIAGNOSIS — H401132 Primary open-angle glaucoma, bilateral, moderate stage: Secondary | ICD-10-CM | POA: Diagnosis not present

## 2022-04-05 DIAGNOSIS — Z09 Encounter for follow-up examination after completed treatment for conditions other than malignant neoplasm: Secondary | ICD-10-CM | POA: Diagnosis not present

## 2022-04-05 DIAGNOSIS — L814 Other melanin hyperpigmentation: Secondary | ICD-10-CM | POA: Diagnosis not present

## 2022-04-05 DIAGNOSIS — L821 Other seborrheic keratosis: Secondary | ICD-10-CM | POA: Diagnosis not present

## 2022-04-05 DIAGNOSIS — L570 Actinic keratosis: Secondary | ICD-10-CM | POA: Diagnosis not present

## 2022-07-11 DIAGNOSIS — H401132 Primary open-angle glaucoma, bilateral, moderate stage: Secondary | ICD-10-CM | POA: Diagnosis not present

## 2022-07-19 DIAGNOSIS — E291 Testicular hypofunction: Secondary | ICD-10-CM | POA: Diagnosis not present

## 2022-07-19 DIAGNOSIS — R7303 Prediabetes: Secondary | ICD-10-CM | POA: Diagnosis not present

## 2022-07-19 DIAGNOSIS — N400 Enlarged prostate without lower urinary tract symptoms: Secondary | ICD-10-CM | POA: Diagnosis not present

## 2022-07-19 DIAGNOSIS — M109 Gout, unspecified: Secondary | ICD-10-CM | POA: Diagnosis not present

## 2022-07-19 DIAGNOSIS — E782 Mixed hyperlipidemia: Secondary | ICD-10-CM | POA: Diagnosis not present

## 2022-07-19 DIAGNOSIS — Z125 Encounter for screening for malignant neoplasm of prostate: Secondary | ICD-10-CM | POA: Diagnosis not present

## 2022-07-19 DIAGNOSIS — G4700 Insomnia, unspecified: Secondary | ICD-10-CM | POA: Diagnosis not present

## 2022-07-19 DIAGNOSIS — Z0000 Encounter for general adult medical examination without abnormal findings: Secondary | ICD-10-CM | POA: Diagnosis not present

## 2022-09-09 DIAGNOSIS — R053 Chronic cough: Secondary | ICD-10-CM | POA: Diagnosis not present

## 2022-11-01 ENCOUNTER — Other Ambulatory Visit: Payer: Self-pay | Admitting: *Deleted

## 2022-11-01 MED ORDER — ROSUVASTATIN CALCIUM 10 MG PO TABS: 10.0000 mg | ORAL_TABLET | Freq: Every day | ORAL | 0 refills | Status: DC

## 2022-11-07 ENCOUNTER — Other Ambulatory Visit: Payer: Self-pay | Admitting: *Deleted

## 2022-11-07 MED ORDER — ROSUVASTATIN CALCIUM 10 MG PO TABS: 10.0000 mg | ORAL_TABLET | Freq: Every day | ORAL | 0 refills | Status: DC

## 2022-11-24 DIAGNOSIS — H401132 Primary open-angle glaucoma, bilateral, moderate stage: Secondary | ICD-10-CM | POA: Diagnosis not present

## 2022-12-14 ENCOUNTER — Other Ambulatory Visit: Payer: Self-pay | Admitting: Cardiology

## 2023-01-05 DIAGNOSIS — L814 Other melanin hyperpigmentation: Secondary | ICD-10-CM | POA: Diagnosis not present

## 2023-01-05 DIAGNOSIS — L570 Actinic keratosis: Secondary | ICD-10-CM | POA: Diagnosis not present

## 2023-01-05 DIAGNOSIS — L821 Other seborrheic keratosis: Secondary | ICD-10-CM | POA: Diagnosis not present

## 2023-01-05 DIAGNOSIS — D225 Melanocytic nevi of trunk: Secondary | ICD-10-CM | POA: Diagnosis not present

## 2023-01-15 ENCOUNTER — Other Ambulatory Visit: Payer: Self-pay | Admitting: Cardiology

## 2023-01-17 ENCOUNTER — Other Ambulatory Visit: Payer: Self-pay

## 2023-01-17 ENCOUNTER — Telehealth: Payer: Self-pay | Admitting: Cardiology

## 2023-01-17 MED ORDER — ROSUVASTATIN CALCIUM 10 MG PO TABS: 10.0000 mg | ORAL_TABLET | Freq: Every day | ORAL | 0 refills | Status: DC

## 2023-01-17 NOTE — Telephone Encounter (Signed)
*  STAT* If patient is at the pharmacy, call can be transferred to refill team.   1. Which medications need to be refilled? (please list name of each medication and dose if known)   rosuvastatin (CRESTOR) 10 MG tablet    2. Which pharmacy/location (including street and city if local pharmacy) is medication to be sent to?  [facility] [id number] - [place], [place] - [address]      3. Do they need a 30 day or 90 day supply? 90 day    Pt is out of medication

## 2023-03-24 NOTE — Progress Notes (Unsigned)
Cardiology Office Note:  .   Date:  03/27/2023  ID:  Maurice Garcia, DOB October 18, 1950, MRN [id number] PCP: Elias Else, MD  [facility] Providers Cardiologist:  Donato Schultz, MD    Patient Profile: .      PMH Coronary artery disease CCTA 11/04/2021 CAC score 179 ((58th percentile) Moderate stenosis in prox LAD not hemodynamically significant by FFR Mild stenosis OM1 Mixed hyperlipidemia Heart murmur TTE 11/04/2021 Trivial TR Mild calcification of aortic valve without AV stenosis HFmrEF TTE 11/04/2021 EF 50-55, G1DD, mild HK of basal-mid inferoseptal and inferior wall, normal RV Carotid artery disease Carotid duplex 11/22/2021 Mild (1-39%) bilateral stenosis  Family history early CAD  Referred to cardiology for evaluation of chest pain and seen by Dr. Anne Fu 10/14/2021.  He had been in the ED 09/21/2021 with chest discomfort.  Reported he woke up from a nap at around 3 PM and had centralized chest pain with radiation to his left arm.  He had tightness pressure and discomfort that did not seem to be worse with deep breathing.  He does not have a history of smoking.  His father and brother had coronary disease in their 67s.  His brother died at a young age.  His troponin was normal in the ED and EKG revealed sinus rhythm at 53 bpm with PVC.  CXR was unremarkable.  He was scheduled for coronary CTA for mapping of coronary anatomy.  His LDL was 95, triglycerides 409.  Apical murmur auscultated and echo was ordered.  Echo 11/04/2021 revealed mildly reduced EF 50 to 55%, G1 DD, mild HK of basal-mid inferior septal and inferior wall, normal RV, mild calcification of the aortic valve without stenosis, trivial TR. CCTA 11/04/2021 showed moderate nonobstructive coronary plaque.  Recommendation to start Crestor 10 mg daily and aspirin 81 mg daily.  He notified our office that his brother had an 85% carotid artery stenosis.  Carotid duplex was ordered and completed 11/22/2021 which revealed mild  bilateral stenosis 1 to 39%.       History of Present Illness: .   Maurice Garcia is a very pleasant 73 y.o. male who is here today for follow-up. He reports feeling well overall. He remains active with numerous grandchildren and walking 3 miles 4-5 times a week. He denies chest pain, shortness of breath, palpitations, orthopnea, PND, edema, presyncope or syncope. He does not monitor BP at home but reports it is consistently good during medical visits. He takes amoxicillin as needed for dental visits due to a previous hip replacement. He eats a healthy diet, influenced by the dietary needs of a special needs son in his care.  He does not have any specific cardiac concerns and we discussed appropriate timing of follow-up.  Discussed the use of AI scribe software for clinical note transcription with the patient, who gave verbal consent to proceed.   ROS: See HPI       Studies Reviewed: Marland Kitchen   EKG Interpretation Date/Time:  Monday March 27 2023 09:39:33 EST Ventricular Rate:  62 PR Interval:  180 QRS Duration:  88 QT Interval:  402 QTC Calculation: 408 R Axis:   87  Text Interpretation: Normal sinus rhythm Normal ECG When compared with ECG of 21-Sep-2021 16:50, No acute changes Confirmed by Eligha Bridegroom [phone]) on 03/27/2023 9:46:47 AM    Risk Assessment/Calculations:             Physical Exam:   VS:  BP 126/72 (BP Location: Right Arm, Patient Position: Sitting, Cuff  Size: Large)   Pulse 62   Ht 6' (1.829 m)   Wt 244 lb 12.8 oz (111 kg)   SpO2 94%   BMI 33.20 kg/m    Wt Readings from Last 3 Encounters:  03/27/23 244 lb 12.8 oz (111 kg)  10/14/21 241 lb (109.3 kg)  09/21/21 237 lb (107.5 kg)    GEN: Well nourished, well developed in no acute distress NECK: No JVD; No carotid bruits CARDIAC: RRR, soft systolic murmur. No rubs, gallops RESPIRATORY:  Clear to auscultation without rales, wheezing or rhonchi  ABDOMEN: Soft, non-tender, non-distended EXTREMITIES:  No edema; No  deformity     ASSESSMENT AND PLAN: .    CAD without angina: Moderate stenosis in prox LAD not hemodynamically significant by FFR on CT 10/2021.  He remains active with regular walking and caring for an adult special needs son and grandchildren. He denies chest pain, dyspnea, or other symptoms concerning for angina. No indication for further ischemic evaluation at this time. EKG today is unremarkable. No bleeding concerns. Cholesterol has been well controlled. Continue aspirin, rosuvastatin along with heart healthy mostly plant based diet avoiding saturated fat, processed foods, simple carbohydrates, and sugar and at least 150 minutes of moderate intensity exercise each week.   Hyperlipidemia: Lipid panel completed 01/20/23 revealed total cholesterol 138, HDL 48, LDL 63, and triglycerides 161. Well controlled. Continue rosuvastatin.  Valve disease: TTE 11/04/2021 revealed trivial TR and mild aortic valve sclerosis without evidence of stenosis.  He has a soft systolic murmur on exam.  He is asymptomatic.  We will continue to monitor clinically for now.  I advised him of symptoms to monitor and report for appropriate follow-up.   Carotid artery disease: Carotid duplex 10/2021 revealed mild (1-39%) bilateral carotid stenosis. He is asymptomatic.  Advised him to notify us if he has concerning symptoms of lightheadedness, dizziness, or symptoms of TIA or stroke.  We will continue to monitor clinically for now.       Disposition:1 year with Dr. Anne Fu  Signed, Eligha Bridegroom, NP-C

## 2023-03-27 ENCOUNTER — Ambulatory Visit: Payer: Medicare HMO | Attending: Nurse Practitioner | Admitting: Nurse Practitioner

## 2023-03-27 ENCOUNTER — Encounter: Payer: Self-pay | Admitting: Nurse Practitioner

## 2023-03-27 VITALS — BP 126/72 | HR 62 | Ht 72.0 in | Wt 244.8 lb

## 2023-03-27 DIAGNOSIS — R011 Cardiac murmur, unspecified: Secondary | ICD-10-CM

## 2023-03-27 DIAGNOSIS — E785 Hyperlipidemia, unspecified: Secondary | ICD-10-CM | POA: Diagnosis not present

## 2023-03-27 DIAGNOSIS — I2510 Atherosclerotic heart disease of native coronary artery without angina pectoris: Secondary | ICD-10-CM

## 2023-03-27 DIAGNOSIS — I6523 Occlusion and stenosis of bilateral carotid arteries: Secondary | ICD-10-CM

## 2023-03-27 DIAGNOSIS — I358 Other nonrheumatic aortic valve disorders: Secondary | ICD-10-CM | POA: Diagnosis not present

## 2023-03-27 MED ORDER — ROSUVASTATIN CALCIUM 10 MG PO TABS
10.0000 mg | ORAL_TABLET | Freq: Every day | ORAL | 3 refills | Status: AC
Start: 2023-03-27 — End: ?

## 2023-03-27 NOTE — Patient Instructions (Signed)
Medication Instructions:   Your physician recommends that you continue on your current medications as directed. Please refer to the Current Medication list given to you today.   *If you need a refill on your cardiac medications before your next appointment, please call your pharmacy*   Lab Work:  None ordered.  If you have labs (blood work) drawn today and your tests are completely normal, you will receive your results only by: MyChart Message (if you have MyChart) OR A paper copy in the mail If you have any lab test that is abnormal or we need to change your treatment, we will call you to review the results.   Testing/Procedures:  None ordered.   Follow-Up: At [facility], you and your health needs are our priority.  As part of our continuing mission to provide you with exceptional heart care, we have created designated Provider Care Teams.  These Care Teams include your primary Cardiologist (physician) and Advanced Practice Providers (APPs -  Physician Assistants and Nurse Practitioners) who all work together to provide you with the care you need, when you need it.  We recommend signing up for the patient portal called "MyChart".  Sign up information is provided on this After Visit Summary.  MyChart is used to connect with patients for Virtual Visits (Telemedicine).  Patients are able to view lab/test results, encounter notes, upcoming appointments, etc.  Non-urgent messages can be sent to your provider as well.   To learn more about what you can do with MyChart, go to [email or web address].    Your next appointment:   1 year(s)  Provider:   Donato Schultz, MD   Other Instructions  Your physician wants you to follow-up in: 1 year.  You will receive a reminder letter in the mail two months in advance. If you don't receive a letter, please call our office to schedule the follow-up appointment.    1st Floor: - Lobby - Registration  - Pharmacy  - Lab - Cafe  2nd  Floor: - PV Lab - Diagnostic Testing (echo, CT, nuclear med)  3rd Floor: - Vacant  4th Floor: - TCTS (cardiothoracic surgery) - AFib Clinic - Structural Heart Clinic - Vascular Surgery  - Vascular Ultrasound  5th Floor: - HeartCare Cardiology (general and EP) - Clinical Pharmacy for coumadin, hypertension, lipid, weight-loss medications, and med management appointments    Valet parking services will be available as well.

## 2023-04-07 DIAGNOSIS — L578 Other skin changes due to chronic exposure to nonionizing radiation: Secondary | ICD-10-CM | POA: Diagnosis not present

## 2023-04-07 DIAGNOSIS — Z09 Encounter for follow-up examination after completed treatment for conditions other than malignant neoplasm: Secondary | ICD-10-CM | POA: Diagnosis not present

## 2023-04-07 DIAGNOSIS — L244 Irritant contact dermatitis due to drugs in contact with skin: Secondary | ICD-10-CM | POA: Diagnosis not present

## 2023-04-11 DIAGNOSIS — J22 Unspecified acute lower respiratory infection: Secondary | ICD-10-CM | POA: Diagnosis not present

## 2023-04-26 DIAGNOSIS — H401132 Primary open-angle glaucoma, bilateral, moderate stage: Secondary | ICD-10-CM | POA: Diagnosis not present

## 2023-07-25 DIAGNOSIS — I38 Endocarditis, valve unspecified: Secondary | ICD-10-CM | POA: Diagnosis not present

## 2023-07-25 DIAGNOSIS — Z1331 Encounter for screening for depression: Secondary | ICD-10-CM | POA: Diagnosis not present

## 2023-07-25 DIAGNOSIS — E291 Testicular hypofunction: Secondary | ICD-10-CM | POA: Diagnosis not present

## 2023-07-25 DIAGNOSIS — E782 Mixed hyperlipidemia: Secondary | ICD-10-CM | POA: Diagnosis not present

## 2023-07-25 DIAGNOSIS — H409 Unspecified glaucoma: Secondary | ICD-10-CM | POA: Diagnosis not present

## 2023-07-25 DIAGNOSIS — Z23 Encounter for immunization: Secondary | ICD-10-CM | POA: Diagnosis not present

## 2023-07-25 DIAGNOSIS — G4700 Insomnia, unspecified: Secondary | ICD-10-CM | POA: Diagnosis not present

## 2023-07-25 DIAGNOSIS — M109 Gout, unspecified: Secondary | ICD-10-CM | POA: Diagnosis not present

## 2023-07-25 DIAGNOSIS — Z0000 Encounter for general adult medical examination without abnormal findings: Secondary | ICD-10-CM | POA: Diagnosis not present

## 2023-07-25 DIAGNOSIS — R7303 Prediabetes: Secondary | ICD-10-CM | POA: Diagnosis not present

## 2023-07-25 DIAGNOSIS — N400 Enlarged prostate without lower urinary tract symptoms: Secondary | ICD-10-CM | POA: Diagnosis not present

## 2023-07-25 DIAGNOSIS — N529 Male erectile dysfunction, unspecified: Secondary | ICD-10-CM | POA: Diagnosis not present

## 2023-07-31 DIAGNOSIS — T1512XA Foreign body in conjunctival sac, left eye, initial encounter: Secondary | ICD-10-CM | POA: Diagnosis not present

## 2023-08-17 DIAGNOSIS — H60391 Other infective otitis externa, right ear: Secondary | ICD-10-CM | POA: Diagnosis not present

## 2023-08-17 DIAGNOSIS — H6691 Otitis media, unspecified, right ear: Secondary | ICD-10-CM | POA: Diagnosis not present

## 2023-08-29 DIAGNOSIS — H6690 Otitis media, unspecified, unspecified ear: Secondary | ICD-10-CM | POA: Diagnosis not present

## 2023-09-04 DIAGNOSIS — M25512 Pain in left shoulder: Secondary | ICD-10-CM | POA: Diagnosis not present

## 2023-09-20 DIAGNOSIS — H401132 Primary open-angle glaucoma, bilateral, moderate stage: Secondary | ICD-10-CM | POA: Diagnosis not present

## 2023-10-04 DIAGNOSIS — M25512 Pain in left shoulder: Secondary | ICD-10-CM | POA: Diagnosis not present

## 2023-11-15 DIAGNOSIS — M25512 Pain in left shoulder: Secondary | ICD-10-CM | POA: Diagnosis not present

## 2023-12-13 DIAGNOSIS — H60392 Other infective otitis externa, left ear: Secondary | ICD-10-CM | POA: Diagnosis not present

## 2024-01-17 DIAGNOSIS — L821 Other seborrheic keratosis: Secondary | ICD-10-CM | POA: Diagnosis not present

## 2024-01-17 DIAGNOSIS — L814 Other melanin hyperpigmentation: Secondary | ICD-10-CM | POA: Diagnosis not present

## 2024-01-17 DIAGNOSIS — D1801 Hemangioma of skin and subcutaneous tissue: Secondary | ICD-10-CM | POA: Diagnosis not present

## 2024-01-17 DIAGNOSIS — L570 Actinic keratosis: Secondary | ICD-10-CM | POA: Diagnosis not present

## 2024-02-09 ENCOUNTER — Encounter: Payer: Self-pay | Admitting: Cardiology

## 2024-03-26 ENCOUNTER — Encounter: Payer: Self-pay | Admitting: Cardiology

## 2024-03-26 ENCOUNTER — Ambulatory Visit: Admitting: Cardiology

## 2024-03-26 VITALS — BP 126/80 | HR 65 | Resp 16 | Ht 72.0 in | Wt 253.2 lb

## 2024-03-26 DIAGNOSIS — I6523 Occlusion and stenosis of bilateral carotid arteries: Secondary | ICD-10-CM | POA: Diagnosis not present

## 2024-03-26 DIAGNOSIS — I2510 Atherosclerotic heart disease of native coronary artery without angina pectoris: Secondary | ICD-10-CM

## 2024-03-26 DIAGNOSIS — I358 Other nonrheumatic aortic valve disorders: Secondary | ICD-10-CM

## 2024-03-26 DIAGNOSIS — E785 Hyperlipidemia, unspecified: Secondary | ICD-10-CM | POA: Diagnosis not present

## 2024-03-26 DIAGNOSIS — R011 Cardiac murmur, unspecified: Secondary | ICD-10-CM | POA: Diagnosis not present

## 2024-03-26 NOTE — Progress Notes (Signed)
 " Cardiology Office Note:  .   Date:  03/26/2024  ID:  Maurice Garcia, DOB 11/23/1950, MRN [id number] PCP: Gib Charleston, MD  [facility] Providers Cardiologist:  Oneil Parchment, MD    History of Present Illness: .   Maurice Garcia is a 74 y.o. male Discussed the use of AI scribe  History of Present Illness Maurice Garcia is a 74 year old male with coronary artery disease who presents with new angina symptoms.  Angina and coronary artery disease - No new angina symptoms present - Coronary CT scan on November 17, 2021 showed moderate stenosis in the proximal LAD - No chest pain, shortness of breath, or trouble breathing during activities  Hyperlipidemia - Managed with rosuvastatin  (Crestor ) 10 mg daily - Most recent LDL was 63 mg/dL - Also taking aspirin  81 mg daily  Cardiac murmur and valvular disease - History of soft systolic murmur, asymptomatic - Prior echocardiogram showed mild aortic valve sclerosis without stenosis  Carotid artery disease - Very minimal carotid artery plaque - No significant stenosis - No associated symptoms  Gout - On colchicine for gout - No recent gout flares  Functional status - Active, regularly walking - Caring for adult special needs son and grandchildren      Studies Reviewed: SABRA   EKG Interpretation Date/Time:  Tuesday March 26 2024 09:56:53 EST Ventricular Rate:  66 PR Interval:  186 QRS Duration:  80 QT Interval:  368 QTC Calculation: 385 R Axis:   93  Text Interpretation: Normal sinus rhythm Rightward axis When compared with ECG of 27-Mar-2023 09:39, No significant change was found Confirmed by Parchment Oneil ([id number]) on 03/26/2024 10:00:29 AM    Results Labs LDL: 63  Radiology FFR CT (10/2021): Moderate proximal LAD stenosis, not hemodynamically significant Carotid imaging: Minimal carotid artery plaque, no significant stenosis  Diagnostic Echocardiogram: Mild aortic valve sclerosis, no stenosis EKG  (03/26/2024): Normal, steady and stable rhythm (Independently interpreted) Risk Assessment/Calculations:            Physical Exam:   VS:  BP 126/80 (BP Location: Right Arm, Patient Position: Sitting, Cuff Size: Large)   Pulse 65   Resp 16   Ht 6' (1.829 m)   Wt 253 lb 3.2 oz (114.9 kg)   SpO2 93%   BMI 34.34 kg/m    Wt Readings from Last 3 Encounters:  03/26/24 253 lb 3.2 oz (114.9 kg)  03/27/23 244 lb 12.8 oz (111 kg)  10/14/21 241 lb (109.3 kg)    GEN: Well nourished, well developed in no acute distress NECK: No JVD; No carotid bruits CARDIAC: RRR, soft systolic murmur, no rubs, no gallops RESPIRATORY:  Clear to auscultation without rales, wheezing or rhonchi  ABDOMEN: Soft, non-tender, non-distended EXTREMITIES:  No edema; No deformity   ASSESSMENT AND PLAN: .    Assessment and Plan Assessment & Plan Stable coronary artery disease Moderate stenosis in the proximal LAD, not hemodynamically significant by FFR CT. No new angina symptoms. EKG is stable. Continues on medical management with aspirin  and rosuvastatin  to stabilize plaque. - Continue aspirin  81 mg daily - Continue rosuvastatin  10 mg daily - Encouraged adherence to a Mediterranean diet - Advised to report any new symptoms such as chest pain or shortness of breath  Hyperlipidemia Well-managed with rosuvastatin . LDL levels previously checked at 63 mg/dL. - Continue rosuvastatin  10 mg daily  Mild carotid artery plaque without significant stenosis Minimal carotid artery plaque with no significant stenosis. No symptoms reported. - Continue  current management and monitoring  Mild aortic valve sclerosis Soft systolic murmur noted on exam. Previous echocardiogram showed mild aortic valve sclerosis without stenosis. Asymptomatic. - Continue monitoring with regular follow-up         Dispo: 1 yr  Signed, Oneil Parchment, MD  "

## 2024-03-26 NOTE — Patient Instructions (Signed)
